# Patient Record
Sex: Male | Born: 1960 | Race: White | Hispanic: No | Marital: Married | Smoking: Never smoker
Health system: Southern US, Community
[De-identification: ages and names within clinical notes are randomized; demographics above are authoritative.]

## PROBLEM LIST (undated history)

## (undated) DIAGNOSIS — E079 Disorder of thyroid, unspecified: Secondary | ICD-10-CM

---

## 2000-01-03 ENCOUNTER — Encounter: Payer: Self-pay | Admitting: *Deleted

## 2000-01-03 ENCOUNTER — Encounter: Admission: RE | Admit: 2000-01-03 | Discharge: 2000-01-03 | Payer: Self-pay | Admitting: *Deleted

## 2014-03-31 ENCOUNTER — Ambulatory Visit (INDEPENDENT_AMBULATORY_CARE_PROVIDER_SITE_OTHER): Payer: BC Managed Care – PPO | Admitting: Internal Medicine

## 2014-03-31 ENCOUNTER — Encounter: Payer: Self-pay | Admitting: Internal Medicine

## 2014-03-31 ENCOUNTER — Other Ambulatory Visit: Payer: Self-pay | Admitting: *Deleted

## 2014-03-31 VITALS — BP 118/72 | HR 70 | Temp 98.3°F | Resp 12 | Ht 68.0 in | Wt 168.8 lb

## 2014-03-31 DIAGNOSIS — E038 Other specified hypothyroidism: Secondary | ICD-10-CM

## 2014-03-31 DIAGNOSIS — E063 Autoimmune thyroiditis: Principal | ICD-10-CM

## 2014-03-31 LAB — TSH: TSH: 7.61 u[IU]/mL — AB (ref 0.35–4.50)

## 2014-03-31 LAB — T4, FREE: FREE T4: 1.22 ng/dL (ref 0.60–1.60)

## 2014-03-31 MED ORDER — UNITHROID 137 MCG PO TABS: 137.0000 ug | ORAL_TABLET | Freq: Every day | ORAL | Status: DC

## 2014-03-31 NOTE — Patient Instructions (Signed)
Please stop at the lab. Please come back for a follow-up appointment in 6 months. We may need to re-check your thyroid labs until then, though.

## 2014-03-31 NOTE — Progress Notes (Signed)
Patient ID: Nathan BranchRoger C Claybrook, male   DOB: Jan 23, 1961, 53 y.o.   MRN: [id number]  HPI  Nathan Estrada is a 53 y.o.-year-old male, referred by his PCP, Dr. Tiburcio PeaHarris, for management of Hashimoto's hypothyroidism.  Pt. has been dx with hypothyroidism in 1998. TPO Abs 1479 at dx; is on Unithroid (levothyroxine, LT4) 125 mcg, taken: - fasting - with water - separated by >45 min from b'fast  - drinks coffee + almond milk - added milk since 12/2013 - no PPIs - + Ca (Tums) at night - + multivitamins at lunch  He tried Levothyroxine and Synthroid >> fatigue, fluctuating thyroid test.  I reviewed pt's thyroid tests: 06/03/2013: TSH 5.5, TPO Abs 202   Pt describes: - + weight gain (this year - mm mass) - + more fatigue, under a lot of stress - no more cold intolerance - no depression/+ anxiety - no constipation/diarrhea - + dry skin on eyelids - no hair falling  Pt denies feeling nodules in neck, hoarseness, dysphagia/odynophagia, SOB with lying down.  She has no FH of thyroid disorders, but has a FH of autoimmune ds (MS in mother, GF). No FH of thyroid cancer.  No h/o radiation tx to head or neck. No recent use of iodine supplements.  ROS: Constitutional: see HPI Eyes: + blurry vision, no xerophthalmia ENT: no sore throat, no nodules palpated in throat, no dysphagia/odynophagia, no hoarseness, + tinnitus Cardiovascular: no CP/SOB/palpitations/leg swelling Respiratory: no cough/SOB Gastrointestinal: no N/V/D/C Musculoskeletal: no muscle/joint aches Skin: no rashes Neurological: no tremors/numbness/tingling/dizziness Psychiatric: no depression/+ anxiety Also: Burning with urination, this is chronic and has been investigated by urology  Past medical history: - Mild hyperlipidemia - Dysthymia  Past surgical history: - None  History   Social History  . Marital Status: Married    Spouse Name: N/A    Number of Children: 5   Occupational History  . Engineer/counselor/student    Social History Main Topics  . Smoking status: Never Smoker   . Smokeless tobacco: No  . Alcohol Use: Yes, 1-2 drinks once a week: Beer and mixed drinks  . Drug Use: No  For exercise, he is walking, running, doing strength exercises, yoga 3-4 times a week  Current Outpatient Rx  Name  Route  Sig  Dispense  Refill  . Biotin (BIOTIN 5000) 5 MG CAPS   Oral   Take 1 capsule by mouth daily.         . Calcium Carbonate Antacid 500 MG CAPS   Oral   Take 2 capsules by mouth every evening.         . Cholecalciferol (VITAMIN D) 1000 UNITS capsule   Oral   Take 1,000 Units by mouth daily.         . CHOLINE PO   Oral   Take 1 capsule by mouth daily. 600 mg         . Coenzyme Q10 (COQ10) 100 MG CAPS   Oral   Take 1 capsule by mouth daily.         . Copper Gluconate (COPPER CAPS) 2 MG CAPS   Oral   Take 1 capsule by mouth daily.         . Cyanocobalamin (B-12) 1000 MCG CAPS   Oral   Take 1 capsule by mouth every evening.         . Inositol 500 MG TABS   Oral   Take 1 tablet by mouth daily.         .Marland Kitchen  Magnesium 500 MG CAPS   Oral   Take 2 capsules by mouth daily.         . Melatonin 5 MG CAPS   Oral   Take 3 capsules by mouth every evening.         . Potassium 99 MG TABS   Oral   Take 1 tablet by mouth daily.         . Selenium 200 MCG CAPS   Oral   Take 1 capsule by mouth daily.         Marland Kitchen. UNITHROID 125 MCG tablet               . Vitamins-Lipotropics (BALANCED B-50 COMPLEX) CAPS   Oral   Take 1 capsule by mouth daily.         . Zinc 30 MG CAPS   Oral   Take 1 capsule by mouth daily.          Allergies  Allergen Reactions  . Doxycycline Rash   Family history: See history of present illness +: - diabetes: Paternal grandmother - hypertension: Father - Hyperlipidemia: Father - Cancer: Father and grandfather (prostate)  PE: BP 118/72 mmHg  Pulse 70  Temp(Src) 98.3 F (36.8 C) (Oral)  Resp 12  Ht 5\' 8"  (1.727 m)  Wt 168  lb 12.8 oz (76.567 kg)  BMI 25.67 kg/m2  SpO2 97% Wt Readings from Last 3 Encounters:  03/31/14 168 lb 12.8 oz (76.567 kg)   Constitutional: normal weight, in NAD Eyes: PERRLA, EOMI, no exophthalmos ENT: moist mucous membranes, no thyromegaly, no cervical lymphadenopathy Cardiovascular: RRR, No MRG Respiratory: CTA B Gastrointestinal: abdomen soft, NT, ND, BS+ Musculoskeletal: no deformities, strength intact in all 4 Skin: moist, warm, no rashes Neurological: no tremor with outstretched hands, DTR normal in all 4  ASSESSMENT: 1. Hypothyroidism - 2/2 Hashimoto's thyroidism  PLAN:  1. Patient with long-standing hypothyroidism, on levothyroxine therapy (DAW Unithroid). He appears euthyroid. He does not appear to have a goiter, thyroid nodules, or neck compression symptoms - We discussed at length about the etiology, evolution, particularities of Hashimoto's thyroiditis. I also explained that I don't usually check thyroid antibodies after the diagnosis of Hashimoto's thyroiditis. We discussed ways to decrease the titer of the antibodies, mostly how to improve his immune system so that the thyroid inflammation caused by the antibodies is decreased. - We discussed about correct intake of levothyroxine, fasting, with water, separated by at least 30 minutes from breakfast, and separated by more than 4 hours from calcium, iron, multivitamins, acid reflux medications (PPIs). He is drinking coffee with almond milk every morning, approximately 50 minutes after he takes his levothyroxine. We discussed that we can maintain this, but will need to adjust his levothyroxine dose to accommodate him drinking the milk so close to the levothyroxine. - will check thyroid tests today: TSH, free T4 - If these are abnormal, he will need to return in 6-8 weeks for repeat labs - If these are normal, I will see him back in 4 months  Office Visit on 03/31/2014  Component Date Value Ref Range Status  . TSH 03/31/2014  7.61* 0.35 - 4.50 uIU/mL Final  . Free T4 03/31/2014 1.22  0.60 - 1.60 ng/dL Final   Will increase the Unithroid to 137 mcg daily. Will need a recheck of labs in 6-8 weeks.

## 2014-05-12 ENCOUNTER — Other Ambulatory Visit (INDEPENDENT_AMBULATORY_CARE_PROVIDER_SITE_OTHER): Payer: BC Managed Care – PPO

## 2014-05-12 DIAGNOSIS — E038 Other specified hypothyroidism: Secondary | ICD-10-CM

## 2014-05-12 DIAGNOSIS — E063 Autoimmune thyroiditis: Secondary | ICD-10-CM

## 2014-05-12 LAB — T4, FREE: Free T4: 1.12 ng/dL (ref 0.60–1.60)

## 2014-05-12 LAB — TSH: TSH: 5.19 u[IU]/mL — ABNORMAL HIGH (ref 0.35–4.50)

## 2014-05-13 ENCOUNTER — Other Ambulatory Visit: Payer: Self-pay | Admitting: Internal Medicine

## 2014-05-13 DIAGNOSIS — E038 Other specified hypothyroidism: Secondary | ICD-10-CM | POA: Insufficient documentation

## 2014-05-13 DIAGNOSIS — E039 Hypothyroidism, unspecified: Secondary | ICD-10-CM

## 2014-05-13 DIAGNOSIS — E063 Autoimmune thyroiditis: Secondary | ICD-10-CM | POA: Insufficient documentation

## 2014-05-13 MED ORDER — UNITHROID 150 MCG PO TABS: 150.0000 ug | ORAL_TABLET | Freq: Every day | ORAL | Status: DC

## 2014-08-17 ENCOUNTER — Telehealth: Payer: Self-pay | Admitting: Internal Medicine

## 2014-08-17 ENCOUNTER — Other Ambulatory Visit (INDEPENDENT_AMBULATORY_CARE_PROVIDER_SITE_OTHER): Payer: BC Managed Care – PPO

## 2014-08-17 DIAGNOSIS — E039 Hypothyroidism, unspecified: Secondary | ICD-10-CM | POA: Diagnosis not present

## 2014-08-17 LAB — T4, FREE: FREE T4: 1.49 ng/dL (ref 0.60–1.60)

## 2014-08-17 LAB — TSH: TSH: 1.93 u[IU]/mL (ref 0.35–4.50)

## 2014-08-17 NOTE — Telephone Encounter (Signed)
Please read message below. I will call pt with his lab results and medication adjustment. Thank you.

## 2014-08-17 NOTE — Telephone Encounter (Signed)
Pt cask that before his medicine is adjusted after  lab results that dr or nurse give him a call

## 2014-08-17 NOTE — Telephone Encounter (Signed)
Labs released in MyChart earlier.

## 2014-08-18 NOTE — Telephone Encounter (Signed)
Called pt and lvm advising him of his lab results. No medication change. Continue on same dose of Unithroid 150 mcg. Advised pt to call back with any questions.

## 2014-09-10 ENCOUNTER — Other Ambulatory Visit: Payer: Self-pay | Admitting: *Deleted

## 2014-09-10 MED ORDER — UNITHROID 150 MCG PO TABS: 150.0000 ug | ORAL_TABLET | Freq: Every day | ORAL | Status: DC

## 2014-09-29 ENCOUNTER — Ambulatory Visit: Payer: BC Managed Care – PPO | Admitting: Internal Medicine

## 2014-10-12 ENCOUNTER — Ambulatory Visit
Admission: RE | Admit: 2014-10-12 | Discharge: 2014-10-12 | Disposition: A | Discharge status: Home / Self Care | Payer: BC Managed Care – PPO | Source: Ambulatory Visit | Attending: Family Medicine | Admitting: Family Medicine

## 2014-10-12 ENCOUNTER — Other Ambulatory Visit: Payer: Self-pay | Admitting: Family Medicine

## 2014-10-12 DIAGNOSIS — R059 Cough, unspecified: Secondary | ICD-10-CM

## 2014-10-12 DIAGNOSIS — R05 Cough: Secondary | ICD-10-CM

## 2014-10-20 ENCOUNTER — Ambulatory Visit (INDEPENDENT_AMBULATORY_CARE_PROVIDER_SITE_OTHER): Payer: BC Managed Care – PPO | Admitting: Internal Medicine

## 2014-10-20 ENCOUNTER — Encounter: Payer: Self-pay | Admitting: Internal Medicine

## 2014-10-20 VITALS — BP 122/68 | HR 54 | Temp 97.4°F | Resp 12 | Wt 173.8 lb

## 2014-10-20 DIAGNOSIS — E038 Other specified hypothyroidism: Secondary | ICD-10-CM

## 2014-10-20 LAB — T4, FREE: Free T4: 1.43 ng/dL (ref 0.60–1.60)

## 2014-10-20 LAB — TSH: TSH: 0.41 u[IU]/mL (ref 0.35–4.50)

## 2014-10-20 MED ORDER — UNITHROID 150 MCG PO TABS: 150.0000 ug | ORAL_TABLET | Freq: Every day | ORAL | Status: DC

## 2014-10-20 NOTE — Patient Instructions (Signed)
Please stop at the lab.  Please return in 1 year.  

## 2014-10-20 NOTE — Progress Notes (Signed)
Patient ID: Nathan Estrada, male   DOB: Apr 04, 1961, 54 y.o.   MRN: [id number]  HPI  Nathan Estrada is a 54 y.o.-year-old male, referred by his PCP, Dr. Tiburcio Pea, for management of Hashimoto's hypothyroidism. Last visit   Pt. has been dx with hypothyroidism in 1998. TPO Abs 1479 at dx; is on Unithroid (levothyroxine, LT4) 125 >> 137 >> 150 mcg now, taken: - fasting - with water - separated by >45 min from b'fast  - drinks coffee + almond milk - added milk since 12/2013 - + Ca (Tums) at night - + multivitamins at lunch - + PPIs at bedtime  He tried Levothyroxine and Synthroid >> fatigue, fluctuating thyroid test.  I reviewed pt's thyroid tests: Lab Results  Component Value Date   TSH 1.93 08/17/2014   TSH 5.19* 05/12/2014   TSH 7.61* 03/31/2014   FREET4 1.49 08/17/2014   FREET4 1.12 05/12/2014   FREET4 1.22 03/31/2014   06/03/2013: TSH 5.5, TPO Abs 202   Pt describes: - + weight gain - + increased appetite - no fatigue - no more cold intolerance - no depression/+ anxiety - no constipation/diarrhea - + dry skin on eyelids, improved - no hair falling  Pt denies feeling nodules in neck, hoarseness, dysphagia/odynophagia, SOB with lying down.  ROS: Constitutional: see HPI Eyes: + blurry vision (works on [employer]im[employer]), no xerophthalmia ENT: + sore throat (acid reflux), no nodules palpated in throat, no dysphagia/odynophagia, no hoarseness, + tinnitus Cardiovascular: no CP/SOB/palpitations/leg swelling Respiratory: no cough/SOB Gastrointestinal: no N/V/D/C, + acid reflux Musculoskeletal: no muscle/joint aches Skin: no rashes Neurological: no tremors/numbness/tingling/dizziness, + HA  Past medical history: - Mild hyperlipidemia - Dysthymia  Past surgical history: - None  History   Social History  . Marital Status: Married    Spouse Name: N/A    Number of Children: 5   Occupational History  . Engineer/counselor/student   Social History Main Topics  . Smoking  status: Never Smoker   . Smokeless tobacco: No  . Alcohol Use: Yes, 1-2 drinks once a week: Beer and mixed drinks  . Drug Use: No  For exercise, he is walking, running, doing strength exercises, yoga 3-4 times a week  Current Outpatient Rx  Name  Route  Sig  Dispense  Refill  . Biotin (BIOTIN 5000) 5 MG CAPS   Oral   Take 1 capsule by mouth daily.         . Calcium Carbonate Antacid 500 MG CAPS   Oral   Take 2 capsules by mouth every evening.         . Cholecalciferol (VITAMIN D) 1000 UNITS capsule   Oral   Take 1,000 Units by mouth daily.         . CHOLINE PO   Oral   Take 1 capsule by mouth daily. 600 mg         . Coenzyme Q10 (COQ10) 100 MG CAPS   Oral   Take 1 capsule by mouth daily.         . Copper Gluconate (COPPER CAPS) 2 MG CAPS   Oral   Take 1 capsule by mouth daily.         . Cyanocobalamin (B-12) 1000 MCG CAPS   Oral   Take 1 capsule by mouth every evening.         . Inositol 500 MG TABS   Oral   Take 1 tablet by mouth daily.         . Magnesium 500 MG  CAPS   Oral   Take 2 capsules by mouth daily.         . Melatonin 5 MG CAPS   Oral   Take 3 capsules by mouth every evening.         . Potassium 99 MG TABS   Oral   Take 1 tablet by mouth daily.         . Selenium 200 MCG CAPS   Oral   Take 1 capsule by mouth daily.         Marland Kitchen. UNITHROID 150 MCG tablet   Oral   Take 1 tablet (150 mcg total) by mouth daily before breakfast.   60 tablet   0     Dispense as written.   . Vitamins-Lipotropics (BALANCED B-50 COMPLEX) CAPS   Oral   Take 1 capsule by mouth daily.         . Zinc 30 MG CAPS   Oral   Take 1 capsule by mouth daily.          Allergies  Allergen Reactions  . Doxycycline Rash   Family history: See history of present illness +: - diabetes: Paternal grandmother - hypertension: Father - Hyperlipidemia: Father - Cancer: Father and grandfather (prostate)  PE: BP 122/68 mmHg  Pulse 54  Temp(Src) 97.4  F (36.3 C) (Oral)  Resp 12  Wt 173 lb 12.8 oz (78.835 kg)  SpO2 97% Body mass index is 26.43 kg/(m^2).  Wt Readings from Last 3 Encounters:  10/20/14 173 lb 12.8 oz (78.835 kg)  03/31/14 168 lb 12.8 oz (76.567 kg)   Constitutional: normal weight, in NAD Eyes: PERRLA, EOMI, no exophthalmos ENT: moist mucous membranes, no thyromegaly, no cervical lymphadenopathy Cardiovascular: RRR, No MRG Respiratory: CTA B Gastrointestinal: abdomen soft, NT, ND, BS+ Musculoskeletal: no deformities, strength intact in all 4 Skin: moist, warm, no rashes Neurological: no tremor with outstretched hands, DTR normal in all 4  ASSESSMENT: 1. Hypothyroidism - 2/2 Hashimoto's thyroidism  PLAN:  1. Patient with long-standing hypothyroidism, on levothyroxine therapy (DAW Unithroid). He appears euthyroid. He does not appear to have a goiter, thyroid nodules, or neck compression symptoms - We discussed about correct intake of levothyroxine, fasting, with water, separated by at least 30 minutes from breakfast, and separated by more than 4 hours from calcium, iron, multivitamins, acid reflux medications (PPIs). He is drinking coffee with almond milk every morning, soon after he takes his levothyroxine. We discussed that we can maintain this if he does this every day. - will check thyroid tests today: TSH, free T4 - If these are abnormal, he will need to return in 6-8 weeks for repeat labs - If these are normal, I will see him back in 1 year  He needs a refill. -  3 mo  Office Visit on 10/20/2014  Component Date Value Ref Range Status  . TSH 10/20/2014 0.41  0.35 - 4.50 uIU/mL Final  . Free T4 10/20/2014 1.43  0.60 - 1.60 ng/dL Final   Thyroid labs normal,  will refill his levothyroxine.

## 2015-01-21 ENCOUNTER — Encounter: Payer: Self-pay | Admitting: Internal Medicine

## 2015-01-21 NOTE — Telephone Encounter (Signed)
Scheduled pt tomorrow morning.

## 2015-01-22 ENCOUNTER — Encounter: Payer: Self-pay | Admitting: Internal Medicine

## 2015-01-22 ENCOUNTER — Ambulatory Visit (INDEPENDENT_AMBULATORY_CARE_PROVIDER_SITE_OTHER): Payer: BC Managed Care – PPO | Admitting: Internal Medicine

## 2015-01-22 VITALS — BP 114/68 | HR 55 | Temp 98.4°F | Resp 12 | Wt 168.0 lb

## 2015-01-22 DIAGNOSIS — E063 Autoimmune thyroiditis: Secondary | ICD-10-CM

## 2015-01-22 DIAGNOSIS — E038 Other specified hypothyroidism: Secondary | ICD-10-CM

## 2015-01-22 LAB — TSH: TSH: 0.68 u[IU]/mL (ref 0.35–4.50)

## 2015-01-22 LAB — T4, FREE: Free T4: 1.53 ng/dL (ref 0.60–1.60)

## 2015-01-22 NOTE — Progress Notes (Addendum)
Patient ID: Nathan Estrada, male   DOB: 03/01/1961, 54 y.o.   MRN: [id number]  HPI  Nathan Estrada is a 54 y.o.-year-old male, returning for f/u for Hashimoto's hypothyroidism, dx 1998. Last visit 3 mo ago.  Pt called the office the other day that he would like to schedule an appt as he is feeling more depressed lately - reviewed tel note: 01/21/2015: Dr. Elvera Lennox,  I would like to see you as soon as possible in order to discuss my current symptoms, check my blood levels and immune system functioning. Since my last appointment with you in April, and after the positive improvements in my TSH, I added running to my exercise regimen. Overall, my fitness has improved significantly, which I'm glad about, but I've recently been having bouts of sadness, weepiness, and lack of motivation, so I'm concerned the new regimen has upset the balance.   He started to run consistently over the summer >> 18 miles a week. He noticed depression intensified last week, but actually started in 12/2014. He also noticed weight change (highest 12/21/2014 - 170 >> now 164 with calorie restriction). He also had heat intolerance off and on.  Pt is on Unithroid (levothyroxine, LT4) 125 >> 137 >> 150 mcg now, taken: - fasting - with water - separated by >45 min from b'fast  - drinks coffee + almond milk - added milk since 12/2013 - + Ca (Tums) at night - + multivitamins at lunch - + PPIs at bedtime  He tried Levothyroxine and Synthroid >> fatigue, fluctuating thyroid tests.  He stopped Biotin last visit.   I reviewed pt's thyroid tests: Lab Results  Component Value Date   TSH 0.41 10/20/2014   TSH 1.93 08/17/2014   TSH 5.19* 05/12/2014   TSH 7.61* 03/31/2014   FREET4 1.43 10/20/2014   FREET4 1.49 08/17/2014   FREET4 1.12 05/12/2014   FREET4 1.22 03/31/2014   06/03/2013: TSH 5.5, TPO Abs 202   TPO Abs 1479 at dx  Pt describes: - + weight gain and loss - no fatigue - + off and on heat intolerance - +  depression - no constipation/diarrhea - no hair loss  Pt denies feeling nodules in neck, hoarseness, dysphagia/odynophagia, SOB with lying down.  ROS + see HPI Constitutional: see HPI Eyes: no blurry vision, no xerophthalmia ENT: no sore throat, no nodules palpated in throat, no dysphagia/odynophagia, no hoarseness Cardiovascular: no CP/SOB/palpitations/leg swelling Respiratory: no cough/SOB Gastrointestinal: no N/V/D/C Musculoskeletal: no muscle/joint aches Skin: no rashes Neurological: no tremors/numbness/tingling/dizziness  Past medical history: - Mild hyperlipidemia - Dysthymia  Past surgical history: - None  History   Social History  . Marital Status: Married    Spouse Name: N/A    Number of Children: 5   Occupational History  . Engineer/counselor/student   Social History Main Topics  . Smoking status: Never Smoker   . Smokeless tobacco: No  . Alcohol Use: Yes, 1-2 drinks once a week: Beer and mixed drinks  . Drug Use: No  For exercise, he is walking, running, doing strength exercises, yoga 3-4 times a week  Current Outpatient Rx  Name  Route  Sig  Dispense  Refill  . Biotin (BIOTIN 5000) 5 MG CAPS   Oral   Take 1 capsule by mouth daily.         . Calcium Carbonate Antacid 500 MG CAPS   Oral   Take 2 capsules by mouth every evening.         . Cholecalciferol (VITAMIN  D) 1000 UNITS capsule   Oral   Take 1,000 Units by mouth daily.         . CHOLINE PO   Oral   Take 1 capsule by mouth daily. 600 mg         . Coenzyme Q10 (COQ10) 100 MG CAPS   Oral   Take 1 capsule by mouth daily.         . Copper Gluconate (COPPER CAPS) 2 MG CAPS   Oral   Take 1 capsule by mouth daily.         . Cyanocobalamin (B-12) 1000 MCG CAPS   Oral   Take 1 capsule by mouth every evening.         . Inositol 500 MG TABS   Oral   Take 1 tablet by mouth daily.         . Magnesium 500 MG CAPS   Oral   Take 2 capsules by mouth daily.         .  Melatonin 5 MG CAPS   Oral   Take 3 capsules by mouth every evening.         . Potassium 99 MG TABS   Oral   Take 1 tablet by mouth daily.         . Selenium 200 MCG CAPS   Oral   Take 1 capsule by mouth daily.         Marland Kitchen UNITHROID 150 MCG tablet   Oral   Take 1 tablet (150 mcg total) by mouth daily before breakfast.   90 tablet   3     Dispense as written.   . Vitamins-Lipotropics (BALANCED B-50 COMPLEX) CAPS   Oral   Take 1 capsule by mouth daily.         . Zinc 30 MG CAPS   Oral   Take 1 capsule by mouth daily.          Allergies  Allergen Reactions  . Doxycycline Rash   Family history: See history of present illness +: - diabetes: Paternal grandmother - hypertension: Father - Hyperlipidemia: Father - Cancer: Father and grandfather (prostate)  PE: BP 114/68 mmHg  Pulse 55  Temp(Src) 98.4 F (36.9 C) (Oral)  Resp 12  Wt 168 lb (76.204 kg)  SpO2 96% Body mass index is 25.55 kg/(m^2).  Wt Readings from Last 3 Encounters:  01/22/15 168 lb (76.204 kg)  10/20/14 173 lb 12.8 oz (78.835 kg)  03/31/14 168 lb 12.8 oz (76.567 kg)   Constitutional: normal weight, in NAD Eyes: PERRLA, EOMI, no exophthalmos ENT: moist mucous membranes, no thyromegaly, no cervical lymphadenopathy Cardiovascular: RRR, No MRG Respiratory: CTA B Gastrointestinal: abdomen soft, NT, ND, BS+ Musculoskeletal: no deformities, strength intact in all 4 Skin: moist, warm, no rashes Neurological: no tremor with outstretched hands, DTR normal in all 4  ASSESSMENT: 1. Hypothyroidism - 2/2 Hashimoto's thyroidism  PLAN:  1. Patient with long-standing hypothyroidism, on levothyroxine therapy (DAW Unithroid). He appears euthyroid, but had more depressive sxs lately. He does not appear to have a goiter, thyroid nodules, or neck compression symptoms. - We discussed about correct intake of levothyroxine, fasting, with water, separated by at least 30 minutes from breakfast, and separated  by more than 4 hours from calcium, iron, multivitamins, acid reflux medications (PPIs). He is taking it correctly. - will check thyroid tests today: TSH, free T4, TPO Abs - advised to separate Selenium in 2 doses - restart Biotin - If these are abnormal,  he will need to return in 6-8 weeks for repeat labs - OTW, I will see him in 09/2015  Office Visit on 01/22/2015  Component Date Value Ref Range Status  . TSH 01/22/2015 0.68  0.35 - 4.50 uIU/mL Final  . Free T4 01/22/2015 1.53  0.60 - 1.60 ng/dL Final  . Thyroperoxidase Ab SerPl-aCnc 01/22/2015 201* <9 IU/mL Final   TFTs normal; antibodies are elevated, confirming Hashimoto's thyroiditis.  The thyroid antibody titer is not higher compared with dietary 05/2013.

## 2015-01-22 NOTE — Patient Instructions (Signed)
Please stop at the lab.  Please come back for a follow-up appointment in 6 months.  

## 2015-01-23 LAB — THYROID PEROXIDASE ANTIBODY: Thyroperoxidase Ab SerPl-aCnc: 201 IU/mL — ABNORMAL HIGH (ref <9)

## 2015-01-26 ENCOUNTER — Encounter: Payer: Self-pay | Admitting: Internal Medicine

## 2015-10-11 ENCOUNTER — Ambulatory Visit (INDEPENDENT_AMBULATORY_CARE_PROVIDER_SITE_OTHER): Payer: BC Managed Care – PPO | Admitting: Internal Medicine

## 2015-10-11 ENCOUNTER — Encounter: Payer: Self-pay | Admitting: Internal Medicine

## 2015-10-11 VITALS — BP 120/84 | HR 57 | Temp 98.1°F | Resp 12 | Wt 171.0 lb

## 2015-10-11 DIAGNOSIS — E063 Autoimmune thyroiditis: Secondary | ICD-10-CM | POA: Diagnosis not present

## 2015-10-11 DIAGNOSIS — E038 Other specified hypothyroidism: Secondary | ICD-10-CM

## 2015-10-11 LAB — T4, FREE: Free T4: 1.43 ng/dL (ref 0.60–1.60)

## 2015-10-11 LAB — T3, FREE: T3, Free: 3.6 pg/mL (ref 2.3–4.2)

## 2015-10-11 LAB — TSH: TSH: 0.67 u[IU]/mL (ref 0.35–4.50)

## 2015-10-11 NOTE — Progress Notes (Signed)
Patient ID: Nathan Estrada, male   DOB: [birth year], 55 y.o.   MRN: [id number]  HPI  Nathan BranchRoger C Frankowski is a 55 y.o.-year-old male, returning for f/u for Hashimoto's hypothyroidism, dx 1998. Last visit 8 mo ago.  Pt is on Unithroid (levothyroxine, LT4) 125 >> 137 >> 150 mcg now, taken: - fasting - with water - separated by >45 min from b'fast  - + Ca (Tums) at bedtime - intermittently - + multivitamins at lunch - + PPIs at bedtime  He tried Levothyroxine and Synthroid >> fatigue, fluctuating thyroid tests. Now stable on thyroid tests.  He was on Biotin, stopped last week.  I reviewed pt's thyroid tests: Component     Latest Ref Rng 01/22/2015  Thyroperoxidase Ab SerPl-aCnc     <9 IU/mL 201 (H)   Lab Results  Component Value Date   TSH 0.68 01/22/2015   TSH 0.41 10/20/2014   TSH 1.93 08/17/2014   TSH 5.19* 05/12/2014   TSH 7.61* 03/31/2014   FREET4 1.53 01/22/2015   FREET4 1.43 10/20/2014   FREET4 1.49 08/17/2014   FREET4 1.12 05/12/2014   FREET4 1.22 03/31/2014  06/03/2013: TSH 5.5, TPO Abs 202   TPO Abs 1479 at dx  Pt denies: - weight gain and loss - fatigue except in the pm - heat intolerance - improved depression - constipation/diarrhea - hair loss  Pt denies feeling nodules in neck, hoarseness, dysphagia/odynophagia, SOB with lying down.  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  ROS + see HPI Constitutional: see HPI Eyes: no blurry vision, no xerophthalmia ENT: no sore throat, no nodules palpated in throat, no dysphagia/odynophagia, no hoarseness Cardiovascular: no CP/SOB/palpitations/leg swelling Respiratory: no cough/SOB Gastrointestinal: no N/V/D/C Musculoskeletal: no muscle/joint aches Skin: no rashes Neurological: no tremors/numbness/tingling/dizziness  I reviewed pt's medications, allergies, PMH, social hx, family hx, and changes were documented in the history of present illness. Otherwise, unchanged from my  initial visit note.  Past medical history: - Mild hyperlipidemia - Dysthymia  Past surgical history: - None  History   Social History  . Marital Status: Married    Spouse Name: N/A    Number of Children: 5   Occupational History  . Engineer/counselor/student   Social History Main Topics  . Smoking status: Never Smoker   . Smokeless tobacco: No  . Alcohol Use: Yes, 1-2 drinks once a week: Beer and mixed drinks  . Drug Use: No  For exercise, he is walking, running, doing strength exercises, yoga 3-4 times a week  Current Outpatient Rx  Name  Route  Sig  Dispense  Refill  . Biotin (BIOTIN 5000) 5 MG CAPS   Oral   Take 1 capsule by mouth daily.         . Calcium Carbonate Antacid 500 MG CAPS   Oral   Take 2 capsules by mouth every evening.         . Cholecalciferol (VITAMIN D) 1000 UNITS capsule   Oral   Take 1,000 Units by mouth daily.         . CHOLINE PO   Oral   Take 1 capsule by mouth daily. 600 mg         . Coenzyme Q10 (COQ10) 100 MG CAPS   Oral   Take 1 capsule by mouth daily.         . Copper Gluconate (COPPER CAPS) 2 MG CAPS   Oral   Take 1 capsule by mouth daily.         .Marland Kitchen  Cyanocobalamin (B-12) 1000 MCG CAPS   Oral   Take 1 capsule by mouth every evening.         . Flaxseed, Linseed, (FLAX SEED OIL) 1000 MG CAPS   Oral   Take 1 capsule by mouth daily.         . Inositol 500 MG TABS   Oral   Take 1 tablet by mouth daily.         . Magnesium 500 MG CAPS   Oral   Take 2 capsules by mouth daily.         . Melatonin 5 MG CAPS   Oral   Take 3 capsules by mouth every evening.         . Potassium 99 MG TABS   Oral   Take 1 tablet by mouth daily.         . Selenium 200 MCG CAPS   Oral   Take 1 capsule by mouth daily.         Marland Kitchen UNITHROID 150 MCG tablet   Oral   Take 1 tablet (150 mcg total) by mouth daily before breakfast.   90 tablet   3     Dispense as written.   . Vitamins-Lipotropics (BALANCED B-50  COMPLEX) CAPS   Oral   Take 1 capsule by mouth daily.         . Zinc 30 MG CAPS   Oral   Take 1 capsule by mouth daily.          Allergies  Allergen Reactions  . Doxycycline Rash   Family history: See history of present illness +: - diabetes: Paternal grandmother - hypertension: Father - Hyperlipidemia: Father - Cancer: Father and grandfather (prostate)  PE: BP 120/84 mmHg  Pulse 57  Temp(Src) 98.1 F (36.7 C) (Oral)  Resp 12  Wt 171 lb (77.565 kg)  SpO2 97% Body mass index is 26.01 kg/(m^2).  Wt Readings from Last 3 Encounters:  10/11/15 171 lb (77.565 kg)  01/22/15 168 lb (76.204 kg)  10/20/14 173 lb 12.8 oz (78.835 kg)   Constitutional: normal weight, in NAD Eyes: PERRLA, EOMI, no exophthalmos ENT: moist mucous membranes, no thyromegaly, no cervical lymphadenopathy Cardiovascular: RRR, No MRG Respiratory: CTA B Gastrointestinal: abdomen soft, NT, ND, BS+ Musculoskeletal: no deformities, strength intact in all 4 Skin: moist, warm, no rashes Neurological: no tremor with outstretched hands, DTR normal in all 4  ASSESSMENT: 1. Hypothyroidism - 2/2 Hashimoto's thyroidism  PLAN:  1. Patient with long-standing hypothyroidism, on levothyroxine therapy (DAW Unithroid). He appears euthyroid and has no complaints other than some pm fatigue. He is able to run ~17 miles a week w/o pbs. No more depression as he sleeps better. He does not appear to have a goiter, thyroid nodules, or neck compression symptoms. - We discussed about correct intake of levothyroxine, fasting, with water, separated by at least 30 minutes from breakfast, and separated by more than 4 hours from calcium, iron, multivitamins, acid reflux medications (PPIs). He is taking it correctly. - will check thyroid tests today: TSH, free T4, free t3 - continue Selenium  - If labs are abnormal, he will need to return in 6-8 weeks for repeat labs - OTW, I will see him in 09/2016  Office Visit on 10/11/2015   Component Date Value Ref Range Status  . Free T4 10/11/2015 1.43  0.60 - 1.60 ng/dL Final  . T3, Free 16/02/9603 3.6  2.3 - 4.2 pg/mL Final  . TSH 10/11/2015 0.67  0.35 - 4.50 uIU/mL Final   Labs are great!

## 2015-10-11 NOTE — Patient Instructions (Signed)
Please continue Unithroid 150 mcg daily.  Take the thyroid hormone every day, with water, at least 30 minutes before breakfast, separated by at least 4 hours from: - acid reflux medications - calcium - iron - multivitamins  Please return in 1 year.

## 2015-10-25 ENCOUNTER — Other Ambulatory Visit: Payer: Self-pay | Admitting: Internal Medicine

## 2016-10-10 ENCOUNTER — Encounter: Payer: Self-pay | Admitting: Internal Medicine

## 2016-10-10 ENCOUNTER — Ambulatory Visit (INDEPENDENT_AMBULATORY_CARE_PROVIDER_SITE_OTHER): Payer: BC Managed Care – PPO | Admitting: Internal Medicine

## 2016-10-10 VITALS — BP 112/74 | HR 78 | Wt 167.0 lb

## 2016-10-10 DIAGNOSIS — E063 Autoimmune thyroiditis: Secondary | ICD-10-CM

## 2016-10-10 DIAGNOSIS — E038 Other specified hypothyroidism: Secondary | ICD-10-CM | POA: Diagnosis not present

## 2016-10-10 LAB — TSH: TSH: 2.42 u[IU]/mL (ref 0.35–4.50)

## 2016-10-10 LAB — T4, FREE: FREE T4: 1.41 ng/dL (ref 0.60–1.60)

## 2016-10-10 NOTE — Progress Notes (Signed)
Patient ID: Nathan Estrada, male   DOB: 1960-10-19, 56 y.o.   MRN: [id number]  HPI  Nathan Estrada is a 56 y.o.-year-old male, returning for f/u for Hashimoto's hypothyroidism, dx 1998. Last visit 1 year ago.  Pt is on Unithroid (levothyroxine) 150 mcg/day: - in am - with water - fasting - at least 45 min from b'fast  - + Tums in the evening - occas. - + MVI moved 1-2h after LT4 - no PPIs  He drinks coffee + half and half.  He tried Levothyroxine and Synthroid >> fatigue, fluctuating TFTs.  He was on Biotin, but stopped months ago.  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  ROS: Constitutional: no weight gain/no weight loss, no fatigue, no subjective hyperthermia, no subjective hypothermia, + insomnia occas. Eyes: no blurry vision, no xerophthalmia ENT: no sore throat, no nodules palpated in throat, no dysphagia, no odynophagia, no hoarseness Cardiovascular: no CP/no SOB/no palpitations/no leg swelling Respiratory: no cough/no SOB/no wheezing Gastrointestinal: no N/no V/no D/no C/no acid reflux Musculoskeletal: no muscle aches/no joint aches Skin: no rashes, no hair loss Neurological: no tremors/no numbness/no tingling/no dizziness  I reviewed pt's medications, allergies, PMH, social hx, family hx, and changes were documented in the history of present illness. Otherwise,  unchanged from my initial visit note.  Past medical history: - Mild hyperlipidemia - Dysthymia  Past surgical history: - None  History   Social History  . Marital Status: Married    Spouse Name: N/A    Number of Children: 5   Occupational History  . Engineer/counselor/student   Social History Main Topics  . Smoking status: Never Smoker   . Smokeless tobacco: No  . Alcohol Use: Yes, 1-2 drinks once a week: Beer and mixed drinks  . Drug Use: No  For exercise, he is walking, running, doing strength exercises, yoga 3-4 times a week  Current Outpatient Prescriptions  Medication Sig Dispense Refill  . Biotin (BIOTIN 5000) 5 MG CAPS Take 1 capsule by mouth daily.    . Calcium Carbonate Antacid 500 MG CAPS Take 2 capsules by mouth every evening.    . Cholecalciferol (VITAMIN D) 1000 UNITS capsule Take 1,000 Units by mouth daily.    . CHOLINE PO Take 1 capsule by mouth daily. 600 mg    . Coenzyme Q10 (COQ10) 100 MG CAPS Take 1 capsule by mouth daily.    . Copper Gluconate (COPPER CAPS) 2 MG CAPS Take 1 capsule by mouth daily.    . Cyanocobalamin (B-12) 1000 MCG CAPS Take 1 capsule by mouth every evening.    . Flaxseed, Linseed, (FLAX SEED OIL) 1000 MG CAPS Take 1 capsule by mouth daily.    . Inositol 500 MG TABS Take 1 tablet by mouth daily.    Marland Kitchen  Magnesium 500 MG CAPS Take 2 capsules by mouth daily.    . Melatonin 5 MG CAPS Take 3 capsules by mouth every evening.    . Potassium 99 MG TABS Take 1 tablet by mouth daily.    . Selenium 200 MCG CAPS Take 1 capsule by mouth daily.    Marland Kitchen. UNITHROID 150 MCG tablet TAKE 1 TABLET (150 MCG TOTAL) BY MOUTH DAILY BEFORE BREAKFAST. 90 tablet 3  . Vitamins-Lipotropics (BALANCED B-50 COMPLEX) CAPS Take 1 capsule by mouth daily.    . Zinc 30 MG CAPS Take 1 capsule by mouth daily.     No current facility-administered medications for this visit.    Allergies  Allergen Reactions  . Doxycycline Rash   Family history: See history of present illness  +: - diabetes: Paternal grandmother - hypertension: Father - Hyperlipidemia: Father - Cancer: Father and grandfather (prostate)  PE: BP 112/74 (BP Location: Left Arm, Patient Position: Sitting)   Pulse 78   Wt 167 lb (75.8 kg)   SpO2 97%   BMI 25.39 kg/m  Body mass index is 25.39 kg/m.  Wt Readings from Last 3 Encounters:  10/10/16 167 lb (75.8 kg)  10/11/15 171 lb (77.6 kg)  01/22/15 168 lb (76.2 kg)   Constitutional: normal weight, in NAD Eyes: PERRLA, EOMI, no exophthalmos ENT: moist mucous membranes, no thyromegaly, no cervical lymphadenopathy Cardiovascular: RRR, No MRG Respiratory: CTA B Gastrointestinal: abdomen soft, NT, ND, BS+ Musculoskeletal: no deformities, strength intact in all 4 Skin: moist, warm, no rashes Neurological: no tremor with outstretched hands, DTR normal in all 4  ASSESSMENT: 1. Hypothyroidism - 2/2 Hashimoto's thyroidism  PLAN:  1. Patient with long-standing hypothyroidism, on levothyroxine therapy (DAW Unithroid).  He does not appear to have a goiter, thyroid nodules, or neck compression symptoms. - latest thyroid labs reviewed with pt >> normal  - he continues on LT4 150 mcg daily - pt feels good on this dose. - we discussed about taking the thyroid hormone every day, with water, >30 minutes before breakfast, separated by >4 hours from acid reflux medications, calcium, iron, multivitamins. He moved his MVI only 1-2h after b'fast >> advised to move them b/f lunch. He also drinks coffee + half and half immediately after LT4, but does this every am. - will check thyroid tests today: TSH and fT4 - If labs are abnormal, he will need to return for repeat TFTs in 1.5 months - OTW, I will see him back in 09/2017  Pt needs a message about labs before refills.  Office Visit on 10/10/2016  Component Date Value Ref Range Status  . TSH 10/10/2016 2.42  0.35 - 4.50 uIU/mL Final  . Free T4 10/10/2016 1.41  0.60 - 1.60 ng/dL Final   Comment: Specimens  from patients who are undergoing biotin therapy and /or ingesting biotin supplements may contain high levels of biotin.  The higher biotin concentration in these specimens interferes with this Free T4 assay.  Specimens that contain high levels  of biotin may cause false high results for this Free T4 assay.  Please interpret results in light of the total clinical presentation of the patient.     Message sent: Dear Mr. Brendia Sacksritchard, The thyroid tests are normal, but the TSH is higher than before, likely because of the multivitamins. No need to change the dose of Unithroid for now, but less tried to move the multivitamins later, as we discussed. Sincerely, Carlus Pavlovristina Darry Kelnhofer MD  Carlus Pavlovristina Skyleen Bentley, MD PhD [facility]

## 2016-10-10 NOTE — Patient Instructions (Signed)
Please stop at the lab.  Please continue Unithroid 150 mcg daily.  Take the thyroid hormone every day, with water, at least 30 minutes before breakfast, separated by at least 4 hours from: - acid reflux medications - calcium - iron - multivitamins  Please return in 1 year.  

## 2016-10-11 ENCOUNTER — Other Ambulatory Visit: Payer: Self-pay | Admitting: Internal Medicine

## 2016-10-11 DIAGNOSIS — E063 Autoimmune thyroiditis: Principal | ICD-10-CM

## 2016-10-11 DIAGNOSIS — E038 Other specified hypothyroidism: Secondary | ICD-10-CM

## 2016-10-11 MED ORDER — UNITHROID 150 MCG PO TABS: ORAL_TABLET | ORAL | 3 refills | Status: DC

## 2016-10-27 ENCOUNTER — Other Ambulatory Visit: Payer: Self-pay | Admitting: Internal Medicine

## 2016-12-11 ENCOUNTER — Encounter: Payer: Self-pay | Admitting: Internal Medicine

## 2017-01-03 ENCOUNTER — Other Ambulatory Visit (INDEPENDENT_AMBULATORY_CARE_PROVIDER_SITE_OTHER): Payer: BC Managed Care – PPO

## 2017-01-03 DIAGNOSIS — E038 Other specified hypothyroidism: Secondary | ICD-10-CM | POA: Diagnosis not present

## 2017-01-03 DIAGNOSIS — E063 Autoimmune thyroiditis: Secondary | ICD-10-CM | POA: Diagnosis not present

## 2017-01-03 LAB — TSH: TSH: 2.23 u[IU]/mL (ref 0.35–4.50)

## 2017-01-03 LAB — T4, FREE: Free T4: 1.54 ng/dL (ref 0.60–1.60)

## 2017-02-08 ENCOUNTER — Encounter (HOSPITAL_COMMUNITY): Payer: Self-pay | Admitting: Emergency Medicine

## 2017-02-08 ENCOUNTER — Emergency Department (HOSPITAL_COMMUNITY): Payer: BC Managed Care – PPO

## 2017-02-08 ENCOUNTER — Emergency Department (HOSPITAL_COMMUNITY)
Admission: EM | Admit: 2017-02-08 | Discharge: 2017-02-08 | Disposition: A | Discharge status: Home / Self Care | Payer: BC Managed Care – PPO | Attending: Emergency Medicine | Admitting: Emergency Medicine

## 2017-02-08 DIAGNOSIS — Z79899 Other long term (current) drug therapy: Secondary | ICD-10-CM | POA: Diagnosis not present

## 2017-02-08 DIAGNOSIS — R5383 Other fatigue: Secondary | ICD-10-CM | POA: Diagnosis not present

## 2017-02-08 DIAGNOSIS — R509 Fever, unspecified: Secondary | ICD-10-CM | POA: Diagnosis not present

## 2017-02-08 DIAGNOSIS — J029 Acute pharyngitis, unspecified: Secondary | ICD-10-CM | POA: Diagnosis present

## 2017-02-08 DIAGNOSIS — J36 Peritonsillar abscess: Secondary | ICD-10-CM | POA: Insufficient documentation

## 2017-02-08 DIAGNOSIS — M791 Myalgia: Secondary | ICD-10-CM | POA: Diagnosis not present

## 2017-02-08 DIAGNOSIS — B370 Candidal stomatitis: Secondary | ICD-10-CM | POA: Insufficient documentation

## 2017-02-08 HISTORY — DX: Disorder of thyroid, unspecified: E07.9

## 2017-02-08 LAB — CBC WITH DIFFERENTIAL/PLATELET
BASOS PCT: 0 %
Basophils Absolute: 0 10*3/uL (ref 0.0–0.1)
EOS ABS: 0 10*3/uL (ref 0.0–0.7)
Eosinophils Relative: 0 %
HEMATOCRIT: 41.6 % (ref 39.0–52.0)
Hemoglobin: 14.1 g/dL (ref 13.0–17.0)
Lymphocytes Relative: 6 %
Lymphs Abs: 1.2 10*3/uL (ref 0.7–4.0)
MCH: 30.9 pg (ref 26.0–34.0)
MCHC: 33.9 g/dL (ref 30.0–36.0)
MCV: 91.2 fL (ref 78.0–100.0)
MONO ABS: 2 10*3/uL — AB (ref 0.1–1.0)
MONOS PCT: 10 %
Neutro Abs: 17.4 10*3/uL — ABNORMAL HIGH (ref 1.7–7.7)
Neutrophils Relative %: 84 %
Platelets: 222 10*3/uL (ref 150–400)
RBC: 4.56 MIL/uL (ref 4.22–5.81)
RDW: 13.3 % (ref 11.5–15.5)
WBC: 20.6 10*3/uL — ABNORMAL HIGH (ref 4.0–10.5)

## 2017-02-08 LAB — BASIC METABOLIC PANEL
Anion gap: 9 (ref 5–15)
BUN: 9 mg/dL (ref 6–20)
CALCIUM: 9.1 mg/dL (ref 8.9–10.3)
CO2: 26 mmol/L (ref 22–32)
CREATININE: 1.01 mg/dL (ref 0.61–1.24)
Chloride: 101 mmol/L (ref 101–111)
GFR calc Af Amer: >60 mL/min (ref >60)
GFR calc non Af Amer: >60 mL/min (ref >60)
GLUCOSE: 121 mg/dL — AB (ref 65–99)
Potassium: 4 mmol/L (ref 3.5–5.1)
Sodium: 136 mmol/L (ref 135–145)

## 2017-02-08 LAB — RAPID STREP SCREEN (MED CTR MEBANE ONLY): STREPTOCOCCUS, GROUP A SCREEN (DIRECT): NEGATIVE

## 2017-02-08 MED ORDER — FENTANYL CITRATE (PF) 100 MCG/2ML IJ SOLN
50.0000 ug | Freq: Once | INTRAMUSCULAR | Status: AC
  Administered 2017-02-08: 50 ug via INTRAVENOUS
  Filled 2017-02-08: qty 2

## 2017-02-08 MED ORDER — CLINDAMYCIN PHOSPHATE 600 MG/50ML IV SOLN
600.0000 mg | Freq: Once | INTRAVENOUS | Status: AC
  Administered 2017-02-08: 600 mg via INTRAVENOUS
  Filled 2017-02-08: qty 50

## 2017-02-08 MED ORDER — IOPAMIDOL (ISOVUE-300) INJECTION 61%
75.0000 mL | Freq: Once | INTRAVENOUS | Status: AC | PRN
  Administered 2017-02-08: 75 mL via INTRAVENOUS

## 2017-02-08 MED ORDER — DEXAMETHASONE SODIUM PHOSPHATE 10 MG/ML IJ SOLN
10.0000 mg | Freq: Once | INTRAMUSCULAR | Status: AC
  Administered 2017-02-08: 10 mg via INTRAVENOUS
  Filled 2017-02-08: qty 1

## 2017-02-08 MED ORDER — SODIUM CHLORIDE 0.9 % IV BOLUS (SEPSIS)
1000.0000 mL | Freq: Once | INTRAVENOUS | Status: AC
  Administered 2017-02-08: 1000 mL via INTRAVENOUS

## 2017-02-08 NOTE — ED Provider Notes (Signed)
[facility]-EMERGENCY DEPT Provider Note   CSN: [id number] Arrival date & time: 02/08/17  1478     History   Chief Complaint Chief Complaint  Patient presents with  . Sore Throat  . Oral Thrush    HPI Nathan Estrada is a 56 y.o. male.  Patient is a 56 year old male who presents with sore throat. He reports a one-week history of worsening sore throat. He was seen in urgent care 4 days ago and prescribed Magic mouthwash for an ulcer in his mouth. His strep test was negative. He states since that time he feels the history is more swelling and is having difficulty swallowing. He feels like his airway swelling. He's also had some reported fevers at home. He is able to swallow his secretions. He is been using over-the-counter medications without improvement in symptoms. Is also using Magic mouthwash without improvement in symptoms. He denies any vomiting. No other rash. No chest pain or shortness of breath.      Past Medical History:  Diagnosis Date  . Thyroid disease     Patient Active Problem List   Diagnosis Date Noted  . Hypothyroidism 05/13/2014    History reviewed. No pertinent surgical history.     Home Medications    Prior to Admission medications   Medication Sig Start Date End Date Taking? Authorizing Provider  Calcium Carbonate Antacid 500 MG CAPS Take 2 capsules by mouth every evening.   Yes [provider]  Cholecalciferol (VITAMIN D) 1000 UNITS capsule Take 1,000 Units by mouth daily.   Yes [provider]  Coenzyme Q10 (COQ10) 100 MG CAPS Take 1 capsule by mouth daily.   Yes [provider]  Cyanocobalamin (B-12) 1000 MCG CAPS Take 1 capsule by mouth every evening.   Yes [provider]  DIPHENHIST 12.5 MG/5ML liquid 3 (three) times daily. Swish and spit MAGIC MOUTHWASH 02/05/17  Yes [provider]  Inositol 500 MG TABS Take 1 tablet by mouth daily.   Yes [provider]  Magnesium 500 MG CAPS Take 1 capsule  by mouth daily.   Yes [provider]  Melatonin 5 MG CAPS Take 6 capsules by mouth every evening.   Yes [provider]  UNITHROID 150 MCG tablet TAKE 1 TABLET (150 MCG TOTAL) BY MOUTH DAILY BEFORE BREAKFAST. 10/11/16  Yes Carlus Pavlov, MD  Zinc 30 MG CAPS Take 1 capsule by mouth daily.   Yes [provider]    Family History No family history on file.  Social History Social History  Substance Use Topics  . Smoking status: Never Smoker  . Smokeless tobacco: Never Used  . Alcohol use 0.0 oz/week     Allergies   Doxycycline   Review of Systems Review of Systems  Constitutional: Positive for fatigue and fever. Negative for chills and diaphoresis.  HENT: Positive for sore throat, trouble swallowing and voice change. Negative for congestion, rhinorrhea and sneezing.   Eyes: Negative.   Respiratory: Negative for cough, chest tightness and shortness of breath.   Cardiovascular: Negative for chest pain and leg swelling.  Gastrointestinal: Negative for abdominal pain, blood in stool, diarrhea, nausea and vomiting.  Genitourinary: Negative for difficulty urinating, flank pain, frequency and hematuria.  Musculoskeletal: Positive for myalgias. Negative for arthralgias and back pain.  Skin: Negative for rash.  Neurological: Negative for dizziness, speech difficulty, weakness, numbness and headaches.     Physical Exam Updated Vital Signs BP (!) 144/83   Pulse 73   Temp 98.7 F (37.1  C) (Oral)   Resp 17   Ht  (1.727 m)   Wt 70.8 kg (156 lb)   SpO2 96%   BMI 23.72 kg/m   Physical Exam  Constitutional: He is oriented to person, place, and time. He appears well-developed and well-nourished.  HENT:  Head: Normocephalic and atraumatic.  Patient has swelling of the oropharynx bilaterally. There is increased swelling of the left peritonsillar area with some deviation of the uvula to the right. No ulcers are noted. No trismus. No elevation of the  tongue.  Eyes: Pupils are equal, round, and reactive to light.  Neck: Normal range of motion. Neck supple.  Cardiovascular: Normal rate, regular rhythm and normal heart sounds.   Pulmonary/Chest: Effort normal and breath sounds normal. No respiratory distress. He has no wheezes. He has no rales. He exhibits no tenderness.  Abdominal: Soft. Bowel sounds are normal. There is no tenderness. There is no rebound and no guarding.  Musculoskeletal: Normal range of motion. He exhibits no edema.  Lymphadenopathy:    He has no cervical adenopathy.  Neurological: He is alert and oriented to person, place, and time.  Skin: Skin is warm and dry. No rash noted.  Psychiatric: He has a normal mood and affect.     ED Treatments / Results  Labs (all labs ordered are listed, but only abnormal results are displayed) Labs Reviewed  BASIC METABOLIC PANEL - Abnormal; Notable for the following:       Result Value   Glucose, Bld 121 (*)    All other components within normal limits  CBC WITH DIFFERENTIAL/PLATELET - Abnormal; Notable for the following:    WBC 20.6 (*)    Neutro Abs 17.4 (*)    Monocytes Absolute 2.0 (*)    All other components within normal limits  RAPID STREP SCREEN (NOT AT [facility])  CULTURE, GROUP A STREP [facility])    EKG  EKG Interpretation None       Radiology Ct Soft Tissue Neck W Contrast  Result Date: 02/08/2017 CLINICAL DATA:  Sore throat and stridor. Epiglottitis or tonsillitis suspected. EXAM: CT NECK WITH CONTRAST TECHNIQUE: Multidetector CT imaging of the neck was performed using the standard protocol following the bolus administration of intravenous contrast. CONTRAST:  75mL ISOVUE-300 IOPAMIDOL (ISOVUE-300) INJECTION 61% COMPARISON:  None. FINDINGS: Pharynx and larynx: There is a rim enhancing left tonsillar fossa collection measuring 22 mm. The parapharyngeal soft tissues are hazy and there is submucosal low-density expansion of the uvula, left more than right oropharynx/  hypopharynx walls, and in the left vallecula. Mild left supraglottic submucosal edema along the aryepiglottic fold. Salivary glands: Negative Thyroid: Negative Lymph nodes: Reactive nodal enlargement.  No cavitation. Vascular: No venous occlusion.  Incidental ICA tortuosity. Limited intracranial: Negative Visualized orbits: Negative Mastoids and visualized paranasal sinuses: Clear Skeleton: No acute or aggressive finding. Upper chest: Negative.  No mediastinitis or visualized pneumonia IMPRESSION: 22 mm left peritonsillar abscess. Pharyngitis with submucosal edema in the oropharynx, hypopharynx, and minimally in the left supraglottic larynx. Electronically Signed   By: Marnee Spring M.D.   On: 02/08/2017 10:35    Procedures Procedures (including critical care time)  Medications Ordered in ED Medications  dexamethasone (DECADRON) injection 10 mg (10 mg Intravenous Given 02/08/17 0830)  clindamycin (CLEOCIN) IVPB 600 mg (0 mg Intravenous Stopped 02/08/17 0900)  fentaNYL (SUBLIMAZE) injection 50 mcg (50 mcg Intravenous Given 02/08/17 0830)  sodium chloride 0.9 % bolus 1,000 mL (0 mLs Intravenous Stopped 02/08/17 0930)  iopamidol (ISOVUE-300) 61 %  injection 75 mL (75 mLs Intravenous Contrast Given 02/08/17 1013)  fentaNYL (SUBLIMAZE) injection 50 mcg (50 mcg Intravenous Given 02/08/17 1047)     Initial Impression / Assessment and Plan / ED Course  I have reviewed the triage vital signs and the nursing notes.  Pertinent labs & imaging results that were available during my care of the patient were reviewed by me and considered in my medical decision making (see chart for details).     PATIENT PRESENTS WITH WORSENING SORE THROAT AND SWELLING TO HIS THROAT. Ct SCAN SHOWS EVIDENCE OF A 2.2 CM LEFT PERITONSILLAR ABSCESS.  I spoke with Dr. Jenne Pane with ENT who requested patient come to his office at 140 this afternoon for drainage of abscess. Patient is in no current distress. He has no stridor. No evidence  of airway compromise. He doesn't like to talk to when he does talk he doesn't have any muffling or change in his voice. He's handling his secretions without difficulty. He was given Decadron and clindamycin in the emergency department. Family was explained the plan and is good with this plan. He'll be discharged home to follow-up with Dr. Jenne Pane at 140 this afternoon. He was advised not to eat or drink prior to this.  Final Clinical Impressions(s) / ED Diagnoses   Final diagnoses:  Peritonsillar abscess    New Prescriptions New Prescriptions   No medications on file     Rolan Bucco, MD 02/08/17 1132

## 2017-02-08 NOTE — ED Triage Notes (Signed)
Patient reports oral thrush with sore throat / swelling onset Sunday unrelieved by Magic Mouthwash prescribed at an urgent care , low last night. grade fever

## 2017-02-10 LAB — CULTURE, GROUP A STREP (THRC)

## 2017-10-10 ENCOUNTER — Ambulatory Visit (INDEPENDENT_AMBULATORY_CARE_PROVIDER_SITE_OTHER): Payer: BC Managed Care – PPO | Admitting: Internal Medicine

## 2017-10-10 ENCOUNTER — Encounter: Payer: Self-pay | Admitting: Internal Medicine

## 2017-10-10 VITALS — BP 124/82 | HR 73 | Ht 68.0 in | Wt 166.0 lb

## 2017-10-10 DIAGNOSIS — R7309 Other abnormal glucose: Secondary | ICD-10-CM | POA: Diagnosis not present

## 2017-10-10 DIAGNOSIS — E063 Autoimmune thyroiditis: Secondary | ICD-10-CM | POA: Diagnosis not present

## 2017-10-10 DIAGNOSIS — E038 Other specified hypothyroidism: Secondary | ICD-10-CM | POA: Diagnosis not present

## 2017-10-10 LAB — T4, FREE: Free T4: 1.37 ng/dL (ref 0.60–1.60)

## 2017-10-10 LAB — TSH: TSH: 2.67 u[IU]/mL (ref 0.35–4.50)

## 2017-10-10 NOTE — Patient Instructions (Signed)
Please stop at the lab.  Please continue Unithroid 150 mcg daily.  Take the thyroid hormone every day, with water, at least 30 minutes before breakfast, separated by at least 4 hours from: - acid reflux medications - calcium - iron - multivitamins  Please return in 1 year.  

## 2017-10-10 NOTE — Progress Notes (Signed)
Patient ID: Nathan Estrada, male   DOB: 02-04-1961, 57 y.o.   MRN: [id number]  HPI  Nathan Estrada is a 57 y.o.-year-old male, returning for f/u for Hashimoto's hypothyroidism, dx 1998. Last visit 1 year ago.  He had a peritonsillar abscess in 01/2017 >> Prednisone, ABx. Saw ENT afterwards >> lanced.  Last June he had a HBA1c of 5.8% by PCP. Since then, he cut out added sugars, increased animal protein. He lost weight and feeling better.  He has another check by PCP in June.  He decreased melatonin from 30 mg to 3 mg since last visit after our discussion.  He read that increase melatonin can suppress insulin production so is grateful that I advised him to do so.  Pt is on levothyroxine (Unithroid d.a.w.) 150 Mcg daily, taken: - in am - fasting  - With water - Drinks coffee with half-and-half right after levothyroxine every morning - at least 30 min from b'fast - no Fe,  PPIs - + MVI later in the day - + Occasional Tums later in the day - not on Biotin  He tried Levothyroxine and Synthroid >> fatigue, fluctuating TFTs.  He was previously on biotin, now off.  I reviewed his TFTs: Lab Results  Component Value Date   TSH 2.23 01/03/2017   TSH 2.42 10/10/2016   TSH 0.67 10/11/2015   TSH 0.68 01/22/2015   TSH 0.41 10/20/2014   FREET4 1.54 01/03/2017   FREET4 1.41 10/10/2016   FREET4 1.43 10/11/2015   FREET4 1.53 01/22/2015   FREET4 1.43 10/20/2014  06/03/2013: TSH 5.5, TPO Abs 202   He has a diagnosis of Hashimoto's thyroiditis based on elevated TPO antibodies: Component     Latest Ref Rng & Units 01/22/2015  Thyroperoxidase Ab SerPl-aCnc     <9 IU/mL 201 (H)  TPO Abs 1479 at dx  Pt denies: - feeling nodules in neck - hoarseness - dysphagia - choking - SOB with lying down  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  He competes in [employer]. He was at the [facility] >> 5th place at 400 m.  ROS: Constitutional: +  Intentional weight loss, + fatigue after exercise, no subjective hyperthermia, no subjective hypothermia, + nocturia 2-3 times a night Eyes: no blurry vision, no xerophthalmia ENT: no sore throat, + see HPI Cardiovascular: no CP/no SOB/no palpitations/no leg swelling Respiratory: no cough/no SOB/no wheezing Gastrointestinal: no N/no V/no D/no C/no acid reflux Musculoskeletal: no muscle aches/no joint aches Skin: no rashes, no hair loss Neurological: no tremors/no numbness/no tingling/no dizziness  I reviewed pt's medications, allergies, PMH, social hx, family hx, and changes were documented in the history of present illness. Otherwise, unchanged from my initial visit note.  Past medical history: - Mild hyperlipidemia - Dysthymia  Past surgical history: - None  History   Social History  . Marital Status: Married    Spouse Name: N/A    Number of Children: 5   Occupational History  . Engineer/counselor/student   Social History Main Topics  . Smoking status: Never Smoker   . Smokeless tobacco: No  . Alcohol Use: Yes, 1-2 drinks once a week: Beer and mixed drinks  . Drug Use: No  For exercise, he is walking, running, doing strength exercises, yoga 3-4 times a week  Current Outpatient Medications  Medication Sig Dispense Refill  . Calcium Carbonate Antacid 500 MG CAPS Take 2 capsules by mouth every evening.    Marland Kitchen  Cholecalciferol (VITAMIN D) 1000 UNITS capsule Take 1,000 Units by mouth daily.    . Coenzyme Q10 (COQ10) 100 MG CAPS Take 1 capsule by mouth daily.    . Cyanocobalamin (B-12) 1000 MCG CAPS Take 1 capsule by mouth every evening.    Marland Kitchen DIPHENHIST 12.5 MG/5ML liquid 3 (three) times daily. Swish and spit MAGIC MOUTHWASH    . Inositol 500 MG TABS Take 1 tablet by mouth daily.    . Magnesium 500 MG CAPS Take 1 capsule by mouth daily.    . Melatonin 5 MG CAPS Take 6 capsules by mouth every evening.    Marland Kitchen UNITHROID 150 MCG tablet TAKE 1 TABLET (150 MCG TOTAL) BY MOUTH DAILY  BEFORE BREAKFAST. 90 tablet 3  . Zinc 30 MG CAPS Take 1 capsule by mouth daily.     No current facility-administered medications for this visit.    Allergies  Allergen Reactions  . Doxycycline Rash   Family history: See history of present illness +: - diabetes: Paternal grandmother - hypertension: Father - Hyperlipidemia: Father - Cancer: Father and grandfather (prostate)  PE: BP 124/82   Pulse 73   Ht  (1.727 m)   Wt 166 lb (75.3 kg)   SpO2 98%   BMI 25.24 kg/m  Body mass index is 25.24 kg/m.  Wt Readings from Last 3 Encounters:  10/10/17 166 lb (75.3 kg)  02/08/17 156 lb (70.8 kg)  10/10/16 167 lb (75.8 kg)   Constitutional: Normal weight, in NAD Eyes: PERRLA, EOMI, no exophthalmos ENT: moist mucous membranes, no thyromegaly, no cervical lymphadenopathy Cardiovascular: RRR, No MRG Respiratory: CTA B Gastrointestinal: abdomen soft, NT, ND, BS+ Musculoskeletal: + deformities (Dupuytren contraction right fifth finger), strength intact in all 4 Skin: moist, warm, no rashes Neurological: no tremor with outstretched hands, DTR normal in all 4  ASSESSMENT: 1. Hypothyroidism - 2/2 Hashimoto's thyroidism  2.  Elevated HbA1c  PLAN:  1. Patient with long-standing hypothyroidism, on levothyroxine therapy (Unithroid d.a.w.), with good control. - latest thyroid labs reviewed with pt >> normal  - he continues on LT4 150 mcg daily - pt feels good on this dose. - we discussed about taking the thyroid hormone every day, with water, >30 minutes before breakfast, separated by >4 hours from acid reflux medications, calcium, iron, multivitamins. Pt. is taking it correctly.  At last visit, he was taking his multivitamin only 1 to 2 hours after breakfast so I advised him to move his multivitamins before lunch or later.  He was also drinking coffee with half-and-half immediately after levothyroxine, but he does do this every morning so we did not change this.   - will check thyroid  tests today: TSH and fT4 - If labs are abnormal, he will need to return for repeat TFTs in 1.5 months  2.  Elevated HbA1c - new to me - He had a slightly elevated HbA1c last year after which he made several changes: He decrease his melatonin dose to now fairly physiologic 3 mg a day and he also change his diet.  He is eating more protein and cut down carbs.  He lost weight intentionally and he is feeling better.  He is also exercising more and participating in [employer] athletics. - We discussed about diet and I made several suggestions, especially reducing animal protein which is conducive to insulin resistance and cellular aging.   - He will have another HbA1c checked by PCP next month - We discussed that at these low levels of HbA1c,  the assay is not very accurate   Needs refills  Office Visit on 10/10/2017  Component Date Value Ref Range Status  . TSH 10/10/2017 2.67  0.35 - 4.50 uIU/mL Final  . Free T4 10/10/2017 1.37  0.60 - 1.60 ng/dL Final   Comment: Specimens from patients who are undergoing biotin therapy and /or ingesting biotin supplements may contain high levels of biotin.  The higher biotin concentration in these specimens interferes with this Free T4 assay.  Specimens that contain high levels  of biotin may cause false high results for this Free T4 assay.  Please interpret results in light of the total clinical presentation of the patient.     Normal TFTs.  Nathan Pavlov, MD PhD [facility] Endocrinology

## 2017-10-11 MED ORDER — UNITHROID 150 MCG PO TABS: ORAL_TABLET | ORAL | 3 refills | Status: DC

## 2017-10-22 IMAGING — CT CT NECK W/ CM
4 of 6 series · 13 of 33 positions shown, 15 images · IV contrast (Omni 300)
Comparison: None.

CLINICAL DATA: Sore throat and stridor. Epiglottitis or tonsillitis
suspected.

EXAM:
CT NECK WITH CONTRAST
TECHNIQUE: Multidetector CT imaging of the neck was performed using the
standard protocol following the bolus administration of intravenous
contrast.
CONTRAST:  75mL FPIRM0-R22 IOPAMIDOL (FPIRM0-R22) INJECTION 61%

[Series 3: neck 2.0 st · axial · 0.34mm/px · z∈[-320,-200]mm · 3 of 120 slices shown, 4 images (1 of 3)]
[im 30/120  soft-tissue]
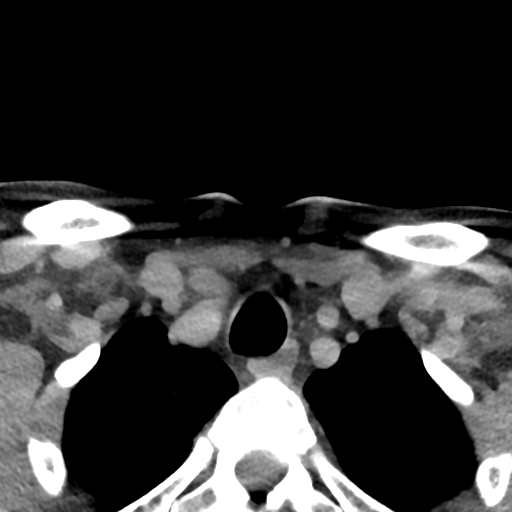
[im 30/120  bone]
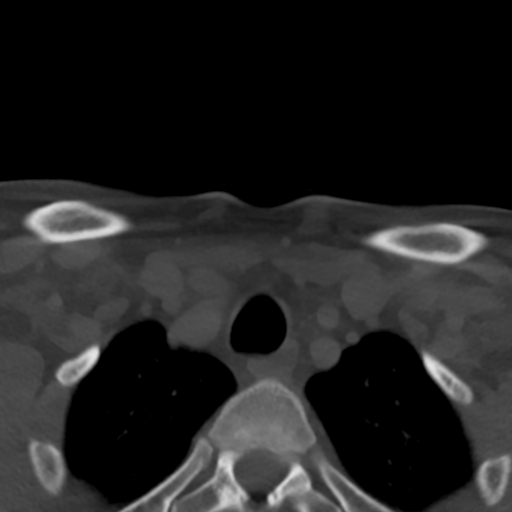
[im 60/120  bone]
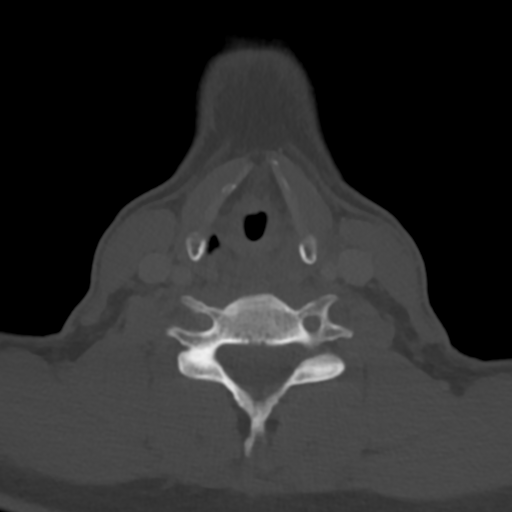
[im 90/120  bone]
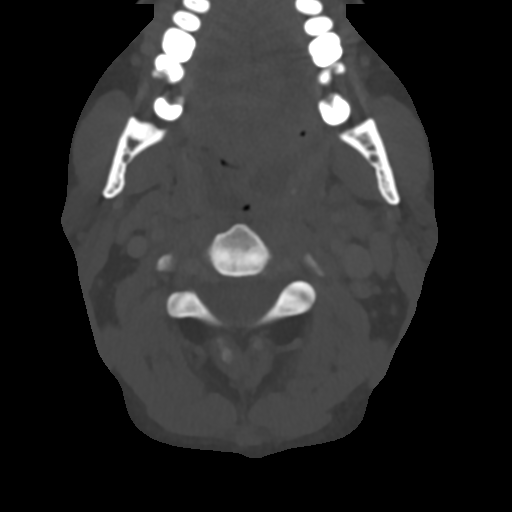

[Series 5: neck 2.0 st · sagittal · 0.35mm/px · 5 of 77 slices shown, 6 images (2 of 3)]
[im 26/77  bone]
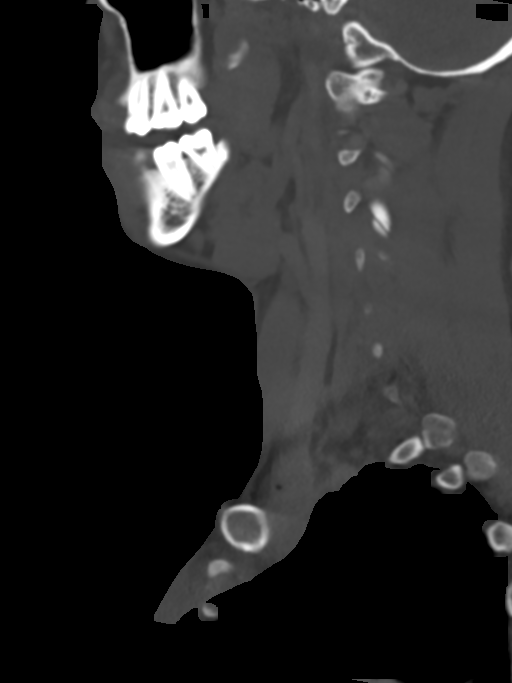
[im 32/77  bone]
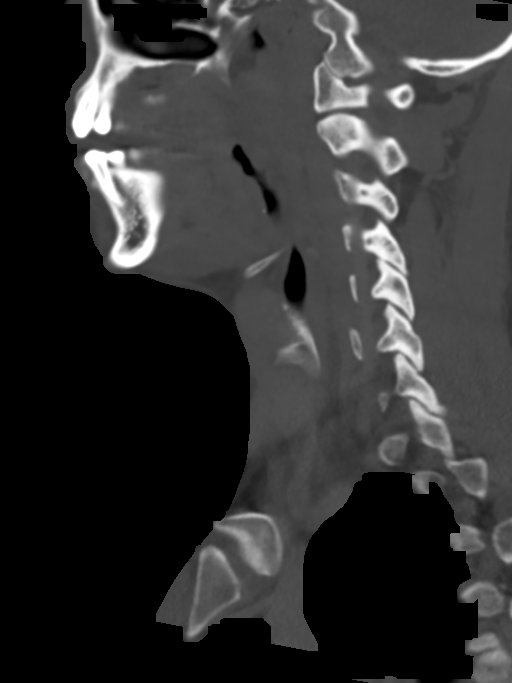
[im 39/77  soft-tissue]
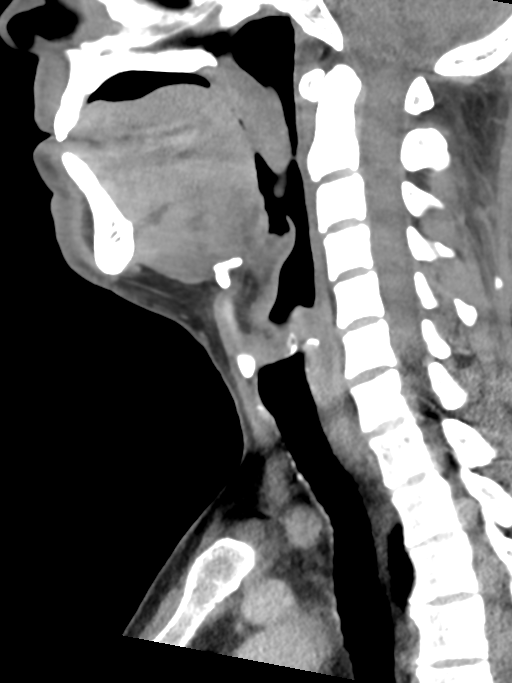
[im 39/77  bone]
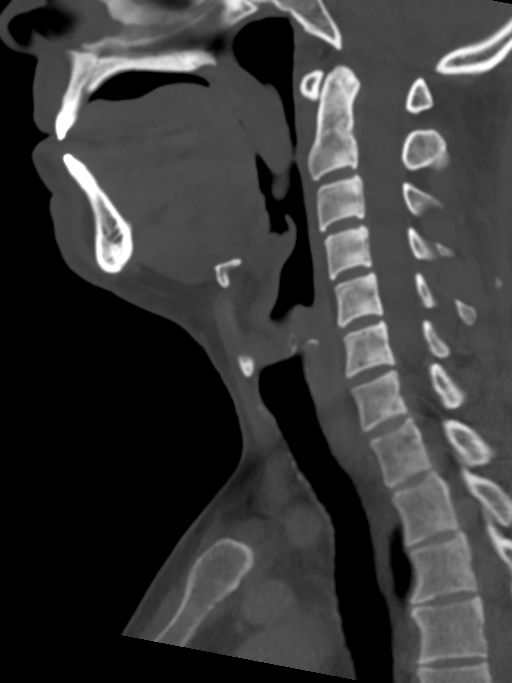
[im 45/77  bone]
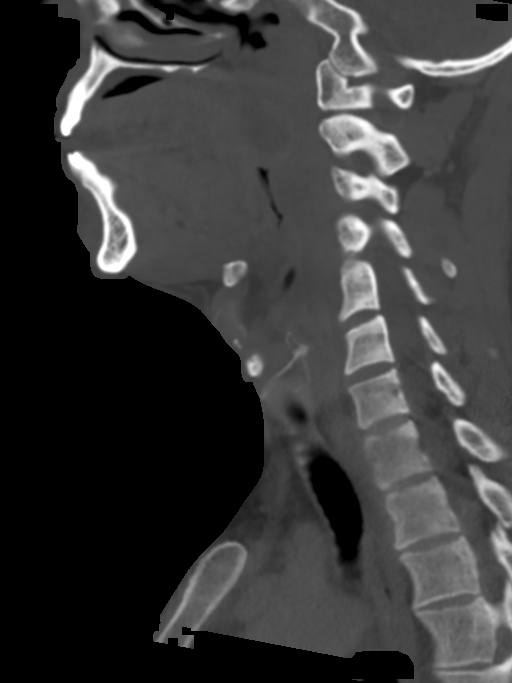
[im 51/77  bone]
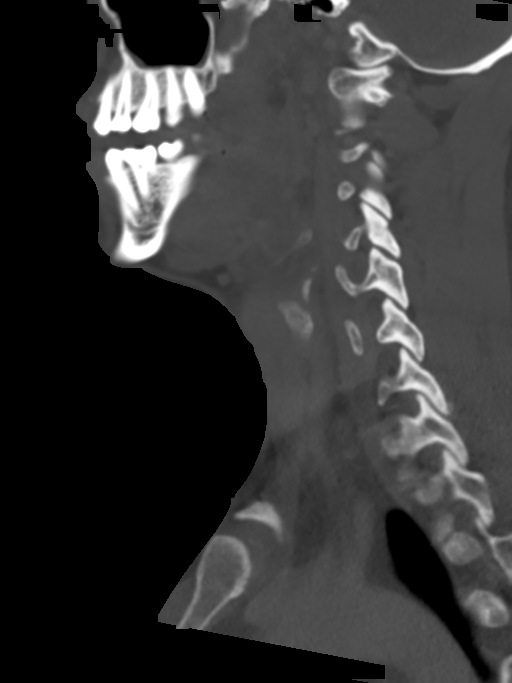

[Series 6: neck 2.0 st · coronal · 0.33mm/px · 3 of 99 slices shown (3 of 3)]
[im 20/99  bone]
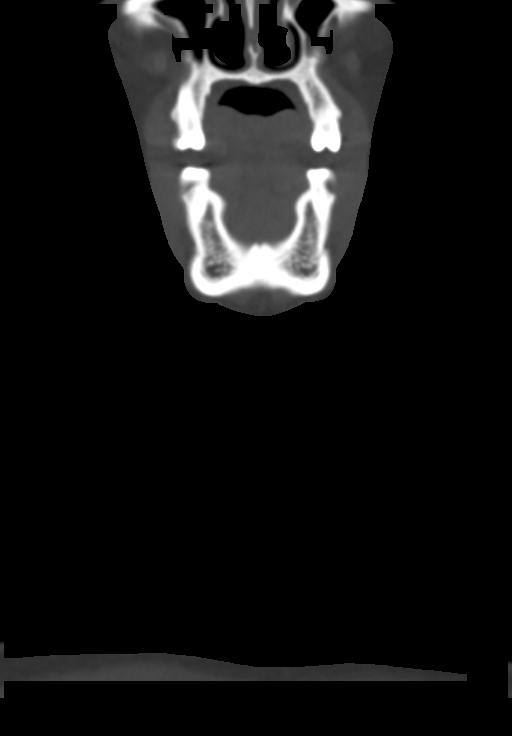
[im 40/99  bone]
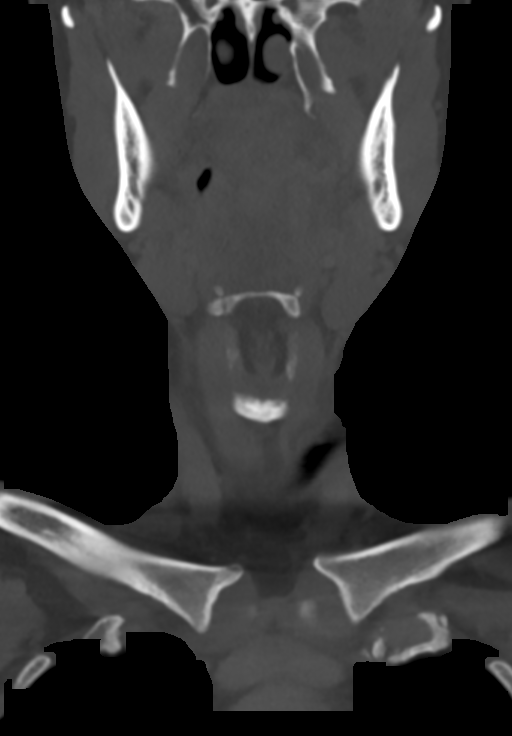
[im 59/99  bone]
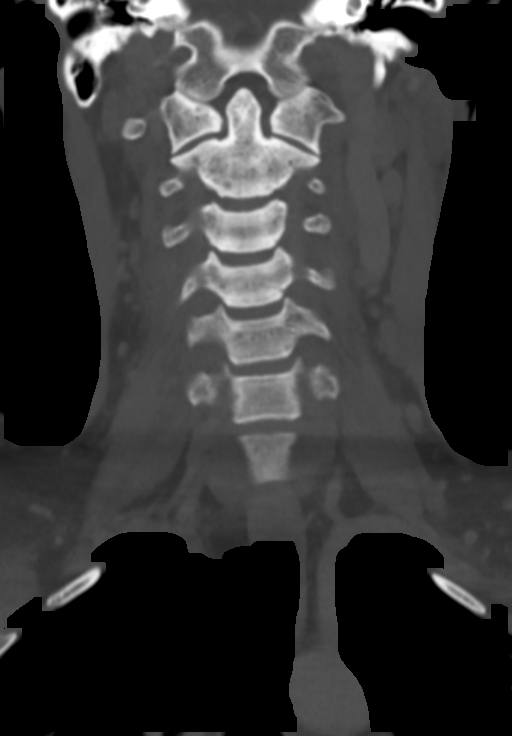

[Series 7: neck 2.0 st orthogonal · axial · 0.29mm/px · z∈[-347,-290]mm · 2 of 119 slices shown]
[im 30/119  bone]
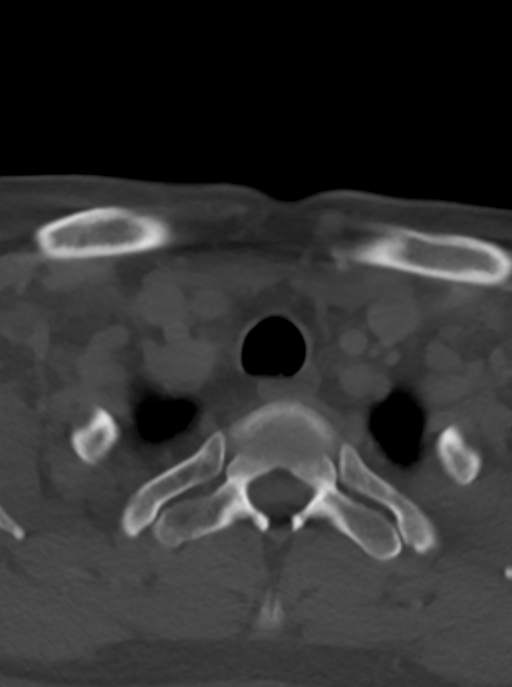
[im 60/119  bone]
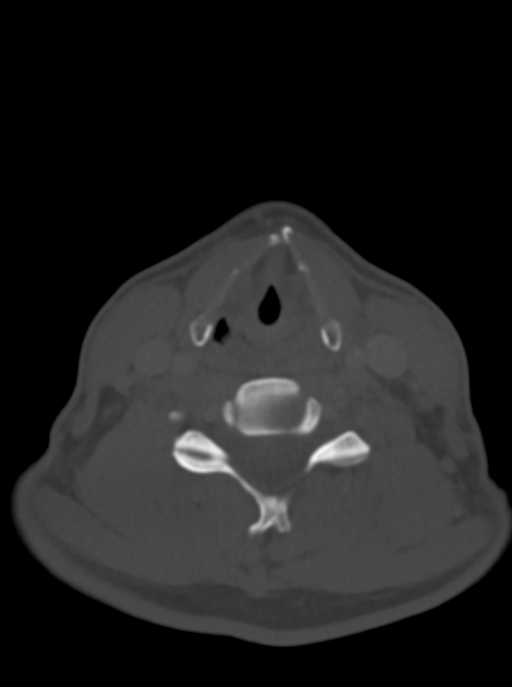

[13 of 33 positions shown; findings below may reference images not displayed]

FINDINGS: Pharynx and larynx: There is a rim enhancing left tonsillar fossa
collection measuring 22 mm. The parapharyngeal soft tissues are hazy
and there is submucosal low-density expansion of the uvula, left
more than right oropharynx/ hypopharynx walls, and in the left
vallecula. Mild left supraglottic submucosal edema along the
aryepiglottic fold.

Salivary glands: Negative

Thyroid: Negative

Lymph nodes: Reactive nodal enlargement.  No cavitation.

Vascular: No venous occlusion.  Incidental ICA tortuosity.

Limited intracranial: Negative

Visualized orbits: Negative

Mastoids and visualized paranasal sinuses: Clear

Skeleton: No acute or aggressive finding.

Upper chest: Negative.  No mediastinitis or visualized pneumonia
IMPRESSION: 22 mm left peritonsillar abscess. Pharyngitis with submucosal edema
in the oropharynx, hypopharynx, and minimally in the left
supraglottic larynx.

## 2018-03-25 ENCOUNTER — Other Ambulatory Visit: Payer: Self-pay | Admitting: Internal Medicine

## 2018-03-25 ENCOUNTER — Encounter: Payer: Self-pay | Admitting: Internal Medicine

## 2018-03-25 DIAGNOSIS — E038 Other specified hypothyroidism: Secondary | ICD-10-CM

## 2018-03-25 DIAGNOSIS — E063 Autoimmune thyroiditis: Principal | ICD-10-CM

## 2018-04-22 ENCOUNTER — Other Ambulatory Visit (INDEPENDENT_AMBULATORY_CARE_PROVIDER_SITE_OTHER): Payer: BC Managed Care – PPO

## 2018-04-22 DIAGNOSIS — E038 Other specified hypothyroidism: Secondary | ICD-10-CM | POA: Diagnosis not present

## 2018-04-22 DIAGNOSIS — E063 Autoimmune thyroiditis: Secondary | ICD-10-CM

## 2018-04-22 LAB — T4, FREE: Free T4: 1.23 ng/dL (ref 0.60–1.60)

## 2018-04-22 LAB — TSH: TSH: 0.62 u[IU]/mL (ref 0.35–4.50)

## 2018-10-08 ENCOUNTER — Other Ambulatory Visit: Payer: Self-pay

## 2018-10-09 ENCOUNTER — Ambulatory Visit: Payer: BC Managed Care – PPO | Admitting: Internal Medicine

## 2018-10-09 ENCOUNTER — Encounter: Payer: Self-pay | Admitting: Internal Medicine

## 2018-10-09 VITALS — BP 110/60 | HR 61 | Temp 98.1°F | Ht 68.0 in | Wt 168.0 lb

## 2018-10-09 DIAGNOSIS — E038 Other specified hypothyroidism: Secondary | ICD-10-CM | POA: Diagnosis not present

## 2018-10-09 DIAGNOSIS — R5383 Other fatigue: Secondary | ICD-10-CM | POA: Diagnosis not present

## 2018-10-09 DIAGNOSIS — E063 Autoimmune thyroiditis: Secondary | ICD-10-CM

## 2018-10-09 DIAGNOSIS — R7309 Other abnormal glucose: Secondary | ICD-10-CM | POA: Diagnosis not present

## 2018-10-09 LAB — T4, FREE: Free T4: 1.21 ng/dL (ref 0.60–1.60)

## 2018-10-09 LAB — TSH: TSH: 5.94 u[IU]/mL — ABNORMAL HIGH (ref 0.35–4.50)

## 2018-10-09 LAB — POCT GLYCOSYLATED HEMOGLOBIN (HGB A1C): Hemoglobin A1C: 5.4 % (ref 4.0–5.6)

## 2018-10-09 NOTE — Patient Instructions (Signed)
Please stop at the lab.  Please continue Unithroid 150 mcg daily.  Take the thyroid hormone every day, with water, at least 30 minutes before breakfast, separated by at least 4 hours from: - acid reflux medications - calcium - iron - multivitamins  Please return in 1 year.

## 2018-10-09 NOTE — Progress Notes (Addendum)
Patient ID: Nathan Estrada, male   DOB: 1960-10-19, 58 y.o.   MRN: [id number]  HPI  Nathan Estrada is a 58 y.o.-year-old male, returning for f/u for Hashimoto's hypothyroidism, dx 1998. Last visit 1 year ago.  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  A bone scan showed other lesions in pelvis and scalp.  She has no pain right now and he continues to run.  In 10/2016, he had a slightly elevated HbA1c of 5.8%, as checked per PCP.  Since then, he cut out added sugars, increased animal protein.  He lost weight and was feeling better.  However, he developed fatigue afterwards so he started to complex carbs back.  He cut down red meat to only twice a months.  He feels much better now.  Pt is on Unithroid d.a.w. 150 Mcg daily, taken: - in am - fasting - drinks coffee with half-and-half right after levothyroxine every morning - at least 30 min from b'fast - no Fe, PPIs - +  MVI later in the day  - + liquid calcium + magnesium + vit D at night - not on Biotin  He also tried levothyroxine and Synthroid >> fatigue, fluctuating TFTs.  Since our last visit, he started l-carnitine to help with energy.  She sent me a message to see if this would influence TFTs.  I advised him that it could so we had him back for repeat TFTs in 04/2018.  These were normal.  He continues on l-carnitine  I reviewed his TFTs: Lab Results  Component Value Date   TSH 0.62 04/22/2018   TSH 2.67 10/10/2017   TSH 2.23 01/03/2017   TSH 2.42 10/10/2016   TSH 0.67 10/11/2015   FREET4 1.23 04/22/2018   FREET4 1.37 10/10/2017   FREET4 1.54 01/03/2017   FREET4 1.41 10/10/2016   FREET4 1.43 10/11/2015  06/03/2013: TSH 5.5, TPO Abs 202   He has a diagnosis of Hashimoto's thyroiditis based on elevated TPO antibodies Component     Latest Ref Rng & Units 01/22/2015  Thyroperoxidase Ab SerPl-aCnc     <9 IU/mL 201 (H)  TPO Abs 1479 at dx  Pt denies: - feeling nodules in  neck - hoarseness - dysphagia - choking - SOB with lying down  He had a peritonsillar abscess in 01/2017 >> Prednisone, ABx. Saw ENT afterwards >> lanced.  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  However, this did not work very well to working as an Art gallery manager.  At last visit, he was competing in [employer]. He was at the [facility] >> 5th place at 400 m.  ROS: Constitutional: no weight gain/no weight loss, no fatigue, no subjective hyperthermia, no subjective hypothermia Eyes: no blurry vision, no xerophthalmia ENT: no sore throat,  + see HPI Cardiovascular: no CP/no SOB/no palpitations/no leg swelling Respiratory: no cough/no SOB/no wheezing Gastrointestinal: no N/no V/no D/no C/no acid reflux Musculoskeletal: no muscle aches/no joint aches Skin: no rashes, no hair loss Neurological: no tremors/no numbness/no tingling/no dizziness  I reviewed pt's medications, allergies, PMH, social hx, family hx, and changes were documented in the history of present illness. Otherwise, unchanged from my initial visit note.  Past medical history: - Mild hyperlipidemia - Dysthymia  Past surgical history: - None  History   Social History  . Marital Status: Married    Spouse Name: N/A    Number of Children: 5  Occupational History  . Engineer/counselor/student   Social History Main Topics  . Smoking status: Never Smoker   . Smokeless tobacco: No  . Alcohol Use: Yes, 1-2 drinks once a week: Beer and mixed drinks  . Drug Use: No  For exercise, he is walking, running, doing strength exercises, yoga 3-4 times a week  Current Outpatient Medications  Medication Sig Dispense Refill  . Calcium Carbonate Antacid 500 MG CAPS Take 2 capsules by mouth every evening.    . Cholecalciferol (VITAMIN D) 1000 UNITS capsule Take 1,000 Units by mouth daily.    . Coenzyme Q10 (COQ10) 100 MG CAPS Take 1 capsule by mouth daily.    . Cyanocobalamin (B-12)  1000 MCG CAPS Take 1 capsule by mouth every evening.    . Inositol 500 MG TABS Take 1 tablet by mouth daily.    . Magnesium 500 MG CAPS Take 1 capsule by mouth daily.    . Melatonin 3 MG TABS Take 1 tablet by mouth daily.    . Multiple Vitamin (MULTIVITAMIN) tablet Take 0.5 tablets by mouth daily.    Marland Kitchen. thiamine (VITAMIN B-1) 50 MG tablet Take 50 mg by mouth daily.    Marland Kitchen. UNITHROID 150 MCG tablet TAKE 1 TABLET (150 MCG TOTAL) BY MOUTH DAILY BEFORE BREAKFAST. 90 tablet 3  . Zinc 30 MG CAPS Take 1 capsule by mouth daily.     No current facility-administered medications for this visit.    Allergies  Allergen Reactions  . Doxycycline Rash   Family history: See history of present illness +: - diabetes: Paternal grandmother - hypertension: Father - Hyperlipidemia: Father - Cancer: Father and grandfather (prostate)  PE: BP 110/60   Pulse 61   Temp 98.1 F (36.7 C)   Ht 5\' 8"  (1.727 m)   Wt 168 lb (76.2 kg)   SpO2 99%   BMI 25.54 kg/m  Body mass index is 25.54 kg/m.  Wt Readings from Last 3 Encounters:  10/09/18 168 lb (76.2 kg)  10/10/17 166 lb (75.3 kg)  02/08/17 156 lb (70.8 kg)   Constitutional: normal weight, in NAD Eyes: PERRLA, EOMI, no exophthalmos ENT: moist mucous membranes, no thyromegaly, no cervical lymphadenopathy Cardiovascular: RRR, No MRG Respiratory: CTA B Gastrointestinal: abdomen soft, NT, ND, BS+ Musculoskeletal: + deformities (Dupuytren contraction right fifth finger), strength intact in all 4 Skin: moist, warm, no rashes Neurological: no tremor with outstretched hands, DTR normal in all 4  ASSESSMENT: 1. Hypothyroidism - 2/2 Hashimoto's thyroidism  2.  Elevated HbA1c  3.  Fatigue  PLAN:  1. Patient with longstanding hypothyroidism, on levothyroxine d.a.w. (Unithroid), with good control.  We tried levothyroxine generic and Synthroid in the past but he could not tolerate these well due to fatigue and he also had fluctuating TFTs.  However, he  tolerates Unithroid very well. - latest thyroid labs reviewed with pt >> normal 04/2018 - he continues on Unithroid 150 mcg daily - pt feels good on this dose. - we discussed about taking the thyroid hormone every day, with water, >30 minutes before breakfast, separated by >4 hours from acid reflux medications, calcium, iron, multivitamins. Pt. is taking it correctly.  Drink coffee with half-and-half immediately after levothyroxine, but he does this every morning, so we will continue this.  This is most likely the reason why he is requiring a higher dose of Unithroid than expected. - will check thyroid tests today: TSH and fT4 - If labs are abnormal, he will need to return for repeat TFTs in  1.5 months  2.  Elevated HbA1c -Patient had a slightly elevated HbA1c last year (5.8%), after which she made several changes: Decrease his melatonin to a more physiologic 3 mg a day and also changed his diet.  He started to eat more protein and cut down carbs.  He lost weight intentionally and he was feeling better.  He also started to exercise more and participated in [employer] athletics.  At last visit we discussed about reducing animal protein which was conducive to insulin resistance and cellular aging.  At that time, he had an HbA1c pending with PCP in the next month so we did not recheck it.  At this visit, patient reports that the A1c from last year was 5.7%. - today, HbA1c is 5.4% (improved)  3. Fatigue -Continues on l-carnitine, which helps -Also, he started to feel much better after he decreased intake of meat and increased starches -Continue to exercise (running) without increased exertional fatigue -We will check TFTs today to see if the dose of his Unithroid needs to be adjusted  Needs refills  Component     Latest Ref Rng & Units 10/09/2018  TSH     0.35 - 4.50 uIU/mL 5.94 (H)  T4,Free(Direct)     0.60 - 1.60 ng/dL 1.32  Hemoglobin G4W     4.0 - 5.6 % 5.4  TSH higher than before,  unexpectedly.  I will check with the patient to see if he remembers changing how he takes the medication , although we reviewed this at the time of the visit  Msg from pt: Dr. Elvera Lennox, I can think of 3 variables that have changed: 1) I increased the L-carnitine from 1gm to 1.5gm, 2) I've increased my training work load about 5 hours per week to 6 or 7 hours a week (70% to 80% low intensity), and 3) I've lost about 4 to 5 lbs of fat and gained 4 to 5 lbs of lean body mass (21%BF to 18%BF).  I have experienced some fatigue, but attributed that mostly to the increased training. I feel good overall. I will cut back to 1 gm on the L-carnitine, and come back at end of June. If it improves, I will reconsider the L-carnitine. Thanks, Estill Bakes, MD PhD [facility] Endocrinology

## 2018-10-10 ENCOUNTER — Encounter: Payer: Self-pay | Admitting: Internal Medicine

## 2018-10-11 ENCOUNTER — Other Ambulatory Visit: Payer: Self-pay | Admitting: Internal Medicine

## 2018-10-11 NOTE — Addendum Note (Signed)
Addended by: Carlus Pavlov on: 10/11/2018 04:31 PM   Modules accepted: Orders

## 2018-11-15 ENCOUNTER — Encounter: Payer: Self-pay | Admitting: Internal Medicine

## 2018-11-15 ENCOUNTER — Other Ambulatory Visit: Payer: Self-pay

## 2018-11-15 ENCOUNTER — Ambulatory Visit: Payer: BC Managed Care – PPO | Admitting: Internal Medicine

## 2018-11-15 ENCOUNTER — Other Ambulatory Visit (INDEPENDENT_AMBULATORY_CARE_PROVIDER_SITE_OTHER): Payer: BC Managed Care – PPO

## 2018-11-15 DIAGNOSIS — E063 Autoimmune thyroiditis: Secondary | ICD-10-CM

## 2018-11-15 DIAGNOSIS — E038 Other specified hypothyroidism: Secondary | ICD-10-CM | POA: Diagnosis not present

## 2018-11-15 LAB — T4, FREE: Free T4: 1.09 ng/dL (ref 0.60–1.60)

## 2018-11-15 LAB — TSH: TSH: 3.64 u[IU]/mL (ref 0.35–4.50)

## 2018-12-15 ENCOUNTER — Encounter: Payer: Self-pay | Admitting: Internal Medicine

## 2019-10-02 ENCOUNTER — Other Ambulatory Visit: Payer: Self-pay | Admitting: Internal Medicine

## 2019-10-13 ENCOUNTER — Other Ambulatory Visit: Payer: Self-pay

## 2019-10-15 ENCOUNTER — Other Ambulatory Visit: Payer: Self-pay

## 2019-10-15 ENCOUNTER — Ambulatory Visit: Payer: BC Managed Care – PPO | Admitting: Internal Medicine

## 2019-10-15 ENCOUNTER — Encounter: Payer: Self-pay | Admitting: Internal Medicine

## 2019-10-15 VITALS — BP 142/98 | HR 49 | Ht 68.0 in | Wt 161.0 lb

## 2019-10-15 DIAGNOSIS — E063 Autoimmune thyroiditis: Secondary | ICD-10-CM | POA: Diagnosis not present

## 2019-10-15 DIAGNOSIS — E038 Other specified hypothyroidism: Secondary | ICD-10-CM

## 2019-10-15 DIAGNOSIS — R7309 Other abnormal glucose: Secondary | ICD-10-CM | POA: Diagnosis not present

## 2019-10-15 LAB — T4, FREE: Free T4: 1.18 ng/dL (ref 0.60–1.60)

## 2019-10-15 LAB — HEMOGLOBIN A1C: Hgb A1c MFr Bld: 5.6 % (ref 4.6–6.5)

## 2019-10-15 LAB — TSH: TSH: 4.64 u[IU]/mL — ABNORMAL HIGH (ref 0.35–4.50)

## 2019-10-15 NOTE — Progress Notes (Addendum)
-Patient ID: Nathan Estrada, male   DOB: 1961-03-08, 59 y.o.   MRN: [id number]   This visit occurred during the SARS-CoV-2 public health emergency.  Safety protocols were in place, including screening questions prior to the visit, additional usage of staff PPE, and extensive cleaning of exam room while observing appropriate contact time as indicated for disinfecting solutions.   HPI  Nathan Estrada is a 59 y.o.-year-old male, returning for f/u for Hashimoto's hypothyroidism, dx 1998, and elevated HbA1c. Last visit 1 year ago.  Elevated HbA1c:  In 10/2016, he had a slightly elevated HbA1c of 5.8%, as checked per PCP.  A repeat HbA1c was 5.7%.  Since then, he cut out added sugars, increased animal protein.  He lost weight and was feeling better.  However, he developed fatigue afterwards so he started to complex carbs back.  He cut down red meat to only twice a month.  He felt much better at last visit.  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  He was wondering whether he needed T3 supplementation.  I was out of the office with my colleague send him a message that this is not needed in the setting of a normal TSH.  He is on Unithroid d.a.w. 150 mcg daily, taken: - in am - fasting - drinks coffee with half-and-half immediately after taking Unithroid - at least 30 min from b'fast -+ Liquid Ca at night - no Fe, PPIs - + MVI later in the day - not on Biotin  She also tried levothyroxine and Synthroid but had fatigue and fluctuating TFTs.  At our last visit, he was on l-carnitine to help with energy.  His TSH returned slightly high.  He stopped the supplement and TSH normalized.  Since last visit, he tried alpha lipoic acid and B1 >>  dizziness, fogginess >> now off.  Reviewed his TFTs:: Lab Results  Component Value Date   TSH 3.64 11/15/2018   TSH 5.94 (H) 10/09/2018   TSH 0.62 04/22/2018   TSH 2.67 10/10/2017   TSH 2.23 01/03/2017   FREET4 1.09 11/15/2018   FREET4 1.21 10/09/2018   FREET4 1.23 04/22/2018   FREET4 1.37 10/10/2017   FREET4 1.54 01/03/2017  06/03/2013: TSH 5.5, TPO Abs 202   He had elevated TPO antibodies: Component     Latest Ref Rng & Units 01/22/2015  Thyroperoxidase Ab SerPl-aCnc     <9 IU/mL 201 (H)  TPO Abs 1479 at dx  Pt denies: - feeling nodules in neck - hoarseness - dysphagia - choking - SOB with lying down  He had a peritonsillar abscess in 01/2017 >> Prednisone, ABx. Saw ENT afterwards >> lanced.  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  In 2019, he was competing in [employer]. He was at the [facility] >> 5th place at 400 m. 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  A bone scan showed other lesions in pelvis and scalp.    He hurt his foot in fall 2020 so he is now not running  as much, but doing strength training.  He was able to lose a significant amount of fat in a muscle.  ROS: Constitutional: no weight gain/+ weight loss, no fatigue, no subjective hyperthermia, no subjective hypothermia Eyes: no blurry vision, no xerophthalmia ENT: no sore throat, + see HPI Cardiovascular: no CP/no SOB/no palpitations/no leg swelling Respiratory: no cough/no SOB/no wheezing Gastrointestinal: no N/no V/no D/no C/no acid reflux Musculoskeletal: no muscle aches/no joint aches Skin: no rashes, no hair loss Neurological: no tremors/no numbness/no tingling/no dizziness  I reviewed pt's medications, allergies, PMH, social hx, family hx, and changes were documented in the history of present illness. Otherwise, unchanged from my  initial visit note.  Past medical history: - Mild hyperlipidemia - Dysthymia  Past surgical history: - None  History   Social History  . Marital Status: Married    Spouse Name: N/A    Number of Children: 5   Occupational History  . Engineer/counselor/student   Social History Main Topics  . Smoking status: Never Smoker   . Smokeless tobacco: No  . Alcohol Use: Yes, 1-2 drinks once a week: Beer and mixed drinks  . Drug Use: No  For exercise, he is walking, running, doing strength exercises, yoga 3-4 times a week  Current Outpatient Medications  Medication Sig Dispense Refill  . Coenzyme Q10 (COQ10) 100 MG CAPS Take 1 capsule by mouth daily.    . Cyanocobalamin (B-12) 1000 MCG CAPS Take 1 capsule by mouth every evening.    . Inositol 500 MG TABS Take 1 tablet by mouth daily.    . Melatonin 3 MG TABS Take 1 tablet by mouth daily.    . Multiple Vitamin (MULTIVITAMIN) tablet Take 0.5 tablets by mouth daily.    Marland Kitchen UNITHROID 150 MCG tablet TAKE 1 TABLET BY MOUTH EVERY DAY BEFORE BREAKFAST 90 tablet 3   No current facility-administered medications for this visit.   Allergies  Allergen Reactions  . Doxycycline Rash   Family history: See history of present illness +: - diabetes: Paternal grandmother - hypertension: Father - Hyperlipidemia: Father - Cancer: Father and grandfather (prostate)  PE: BP (!) 142/98   Pulse (!) 49   Ht 5\' 8"  (1.727 m)   Wt 161 lb (73 kg)   SpO2 99%   BMI 24.48 kg/m  Body mass index is 24.48 kg/m.  Wt Readings from Last 3 Encounters:  10/15/19 161 lb (73 kg)  10/09/18 168 lb (76.2 kg)  10/10/17 166 lb (75.3 kg)   Constitutional: normal weight, in NAD Eyes: PERRLA, EOMI, no exophthalmos ENT: moist mucous membranes, no thyromegaly, no cervical lymphadenopathy Cardiovascular: RRR, No MRG Respiratory: CTA B Gastrointestinal: abdomen soft, NT, ND, BS+ Musculoskeletal: + deformities (Dupuytren contraction in the right fifth finger), strength  intact in all 4 Skin: moist, warm, no rashes Neurological: no tremor with outstretched hands, DTR normal in all 4  ASSESSMENT: 1. Hypothyroidism - 2/2 Hashimoto's thyroidism  2.  Elevated HbA1c  PLAN:  1. Patient with longstanding autoimmune hypothyroidism, on levothyroxine d.a.w. (Unithroid), with good control.  We tried levothyroxine generic and Synthroid in the past but he could not tolerate these well due to fatigue and fluctuating TFTs.  However, he tolerates Unithroid very well.  At last visit, his TSH returned slightly high but this normalized after he decreased, then stopped his carnitine supplement. - latest thyroid labs reviewed with pt >> normal: Lab Results  Component Value Date   TSH 3.64 11/15/2018   - he continues on Unithroid 150 mcg daily -  pt feels good on this dose. - we discussed about taking the thyroid hormone every day, with water, >30 minutes before breakfast, separated by >4 hours from acid reflux medications, calcium, iron, multivitamins. Pt. is taking it correctly.  He does drink coffee + half-and-half immediately after the levothyroxine, but he does this every morning, so we can continue this.  This is probably the reason why he is requiring a slightly higher dose of Unithroid than expected. - will check thyroid tests today: TSH and fT4 - If labs are abnormal, he will need to return for repeat TFTs in 1.5 months  2.  Elevated HbA1c -He had 2 slightly elevated HbA1c (5.8% at 5.7% last year), after which he made several changes: Decreased his melatonin supplement, changed his diet (increased protein and cut down carbs), and also increased exercise.  He is participating in [employer] athletics.  We did discuss about reducing animal protein at last visit, since this is conducive to insulin resistance and cellular aging.  He also reduced and then stopped his carnitine supplement since last visit.  At that time, HbA1c returned improved, at 5.4%. -At this visit,  sugars are mostly at goal in the morning except when he has a higher protein and fat dinner.  We discussed about the benefits of a plant-based diet both for people with diabetes and without.  Also, given references about podcasts discussing diet in relationship with physical training -We will repeat HbA1c today  Needs refills.  Office Visit on 10/15/2019  Component Date Value Ref Range Status  . TSH 10/15/2019 4.64* 0.35 - 4.50 uIU/mL Final  . Free T4 10/15/2019 1.18  0.60 - 1.60 ng/dL Final   Comment: Specimens from patients who are undergoing biotin therapy and /or ingesting biotin supplements may contain high levels of biotin.  The higher biotin concentration in these specimens interferes with this Free T4 assay.  Specimens that contain high levels  of biotin may cause false high results for this Free T4 assay.  Please interpret results in light of the total clinical presentation of the patient.    . Hgb A1c MFr Bld 10/15/2019 5.6  4.6 - 6.5 % Final   Glycemic Control Guidelines for People with Diabetes:Non Diabetic:  <6%Goal of Therapy: <7%Additional Action Suggested:  >8%    HbA1c still normal, slightly high.  TSH slightly high.  Normal free T4.  Msg sent: Dear Cecilie Lowers, Yes, the TSH appears to be slightly higher than goal.  We have several options: 1.  Increase the levothyroxine dose 2.  Move your coffee at least 30 minutes after levothyroxine 3.  Continue the same levothyroxine dose and repeat the tests in 1.5 months. I will let you choose which option you would like to continue with. Sincerely, Philemon Kingdom MD  Pt would like to continue the same dose of Unithroid and recheck his test in 1.5 months.  Philemon Kingdom, MD PhD [facility] Endocrinology

## 2019-10-15 NOTE — Patient Instructions (Addendum)
Please stop at the lab.  Please continue Unithroid 150 mcg daily.  Take the thyroid hormone every day, with water, at least 30 minutes before breakfast, separated by at least 4 hours from: - acid reflux medications - calcium - iron - multivitamins  Check out: - [place] podcast - Loyal Gambler, MD - [employer] podcast - [employer]  Please return in 1 year.

## 2019-10-16 ENCOUNTER — Encounter: Payer: Self-pay | Admitting: Internal Medicine

## 2019-10-16 NOTE — Addendum Note (Signed)
Addended by: Carlus Pavlov on: 10/16/2019 05:11 PM   Modules accepted: Orders

## 2020-03-09 ENCOUNTER — Other Ambulatory Visit: Payer: Self-pay | Admitting: Family Medicine

## 2020-03-09 DIAGNOSIS — R1011 Right upper quadrant pain: Secondary | ICD-10-CM

## 2020-03-18 ENCOUNTER — Ambulatory Visit
Admission: RE | Admit: 2020-03-18 | Discharge: 2020-03-18 | Disposition: A | Discharge status: Home / Self Care | Payer: BC Managed Care – PPO | Source: Ambulatory Visit | Attending: Family Medicine | Admitting: Family Medicine

## 2020-03-18 DIAGNOSIS — R1011 Right upper quadrant pain: Secondary | ICD-10-CM

## 2020-10-05 ENCOUNTER — Other Ambulatory Visit: Payer: Self-pay | Admitting: Internal Medicine

## 2020-10-13 ENCOUNTER — Ambulatory Visit: Payer: BC Managed Care – PPO | Admitting: Internal Medicine

## 2020-10-13 ENCOUNTER — Encounter: Payer: Self-pay | Admitting: Internal Medicine

## 2020-10-13 ENCOUNTER — Other Ambulatory Visit: Payer: Self-pay

## 2020-10-13 VITALS — BP 120/88 | HR 53 | Ht 68.0 in | Wt 160.2 lb

## 2020-10-13 DIAGNOSIS — E038 Other specified hypothyroidism: Secondary | ICD-10-CM

## 2020-10-13 DIAGNOSIS — E063 Autoimmune thyroiditis: Secondary | ICD-10-CM

## 2020-10-13 DIAGNOSIS — R7303 Prediabetes: Secondary | ICD-10-CM | POA: Diagnosis not present

## 2020-10-13 LAB — POCT GLYCOSYLATED HEMOGLOBIN (HGB A1C): Hemoglobin A1C: 5.5 % (ref 4.0–5.6)

## 2020-10-13 LAB — T4, FREE: Free T4: 1.42 ng/dL (ref 0.60–1.60)

## 2020-10-13 LAB — TSH: TSH: 0.47 u[IU]/mL (ref 0.35–4.50)

## 2020-10-13 NOTE — Progress Notes (Signed)
-Patient ID: Nathan Estrada, male   DOB: 1960/07/10, 60 y.o.   MRN: [id number]   This visit occurred during the SARS-CoV-2 public health emergency.  Safety protocols were in place, including screening questions prior to the visit, additional usage of staff PPE, and extensive cleaning of exam room while observing appropriate contact time as indicated for disinfecting solutions.   HPI  Nathan Estrada is a 60 y.o.-year-old male, returning for f/u for Hashimoto's hypothyroidism, dx 1998, and elevated HbA1c. Last visit 1 year ago.  Interim history: He has frequent sinus inflammation. He still trains intensively: 3-4x a week and slightly lighter exercise in the rest of the week. Usually 1.5h a day. He eats a low carb diet.  Elevated HbA1c: In 10/2016, he had a slightly elevated HbA1c of 5.8%, as checked per PCP.  A repeat HbA1c was 5.7%.  Since then, he cut out added sugars, increased animal protein.  He lost weight and was feeling better.   However, he developed fatigue afterwards so he started to complex carbs back.  He cut down red meat to only twice a month.  He felt much better.   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  He was wondering whether he needed T3 supplementation.  I was out of the office with my colleague send him a message that this is not needed in the setting of a normal TSH.  He continues on LT4 only.  He is on Unithroid d.a.w. 150 mcg daily, taken: - in am - fasting - drinks coffee with half-and-half immediately after taking Unithroid - 2h from b'fast -+ Liquid Ca at night - no Fe, PPIs - + MVI later in the day - not on Biotin  She also  tried levothyroxine and Synthroid but had fatigue and fluctuating TFTs. Before last visit, he tried alpha lipoic acid and B1 >> dizziness, fogginess >> stopped.  She was previously on l-carnitine to help with energy.  His TSH returned slightly high.  He stopped the supplement and TSH normalized.  However, last TSH was again slightly high:  Reviewed his TFTs: Lab Results  Component Value Date   TSH 4.64 (H) 10/15/2019   TSH 3.64 11/15/2018   TSH 5.94 (H) 10/09/2018   TSH 0.62 04/22/2018   TSH 2.67 10/10/2017   FREET4 1.18 10/15/2019   FREET4 1.09 11/15/2018   FREET4 1.21 10/09/2018   FREET4 1.23 04/22/2018   FREET4 1.37 10/10/2017  06/03/2013: TSH 5.5, TPO Abs 202   He had elevated TPO antibodies: Component     Latest Ref Rng & Units 01/22/2015  Thyroperoxidase Ab SerPl-aCnc     <9 IU/mL 201 (H)  TPO Abs 1479 at dx  Pt denies: - feeling nodules in neck - hoarseness - dysphagia - choking - SOB with lying down  He had a peritonsillar abscess in 01/2017 >> Prednisone, ABx. Saw ENT afterwards >> lanced.  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  In 2019, he was competing in [employer]. He was  at the [place] >> 5th place at 400 m. 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  A bone scan showed other lesions in pelvis and scalp.    ROS: Constitutional: no weight gain/no weight loss, no fatigue, no subjective hyperthermia, no subjective hypothermia Eyes: no blurry vision, no xerophthalmia ENT: no sore throat, + see HPI Cardiovascular: no CP/no SOB/no palpitations/no leg swelling Respiratory: no cough/no SOB/no wheezing Gastrointestinal: no N/no V/no D/no C/no acid reflux Musculoskeletal: no muscle aches/no joint aches Skin: no rashes, no hair loss Neurological: no tremors/no numbness/no tingling/no dizziness  I  reviewed pt's medications, allergies, PMH, social hx, family hx, and changes were documented in the history of present illness. Otherwise, unchanged from my initial visit note.  Past medical history: - Mild hyperlipidemia - Dysthymia  Past surgical history: - None  History   Social History  . Marital Status: Married    Spouse Name: N/A    Number of Children: 5   Occupational History  . Engineer/counselor/student   Social History Main Topics  . Smoking status: Never Smoker   . Smokeless tobacco: No  . Alcohol Use: Yes, 1-2 drinks once a week: Beer and mixed drinks  . Drug Use: No  For exercise, he is walking, running, doing strength exercises, yoga 3-4 times a week  Current Outpatient Medications  Medication Sig Dispense Refill  . Cyanocobalamin (B-12) 1000 MCG CAPS Take 1 capsule by mouth every evening.    . Melatonin 3 MG TABS Take 1 tablet by mouth daily.    . Multiple Vitamin (MULTIVITAMIN) tablet Take 0.5 tablets by mouth daily.    Marland Kitchen UNITHROID 150 MCG tablet TAKE 1 TABLET BY MOUTH EVERY DAY BEFORE BREAKFAST 30 tablet 0   No current facility-administered medications for this visit.   Allergies  Allergen Reactions  . Doxycycline Rash   Family history: See history of present illness +: - diabetes: Paternal grandmother - hypertension: Father - Hyperlipidemia: Father - Cancer: Father and grandfather (prostate)  PE: BP 120/88 (BP Location: Right Arm, Patient Position: Sitting, Cuff Size: Normal)   Pulse (!) 53   Ht 5\' 8"  (1.727 m)   Wt 160 lb 3.2 oz (72.7 kg)   SpO2 99%   BMI 24.36 kg/m  Body mass index is 24.36 kg/m.  Wt Readings from Last 3 Encounters:  10/13/20 160 lb 3.2 oz (72.7 kg)  10/15/19 161 lb (73 kg)  10/09/18 168 lb (76.2 kg)   Constitutional: normal weight, in NAD Eyes: PERRLA, EOMI, no exophthalmos ENT: moist mucous membranes, no thyromegaly, no cervical lymphadenopathy Cardiovascular: RRR, No MRG Respiratory: CTA B Gastrointestinal:  abdomen soft, NT, ND, BS+ Musculoskeletal: + deformities (Dupuytren contraction in the right fifth finger), strength intact in all 4 Skin: moist, warm, no rashes Neurological: no tremor with outstretched hands, DTR normal in all 4  ASSESSMENT: 1. Hypothyroidism - 2/2 Hashimoto's thyroidism  2.  Prediabetes  PLAN:  1. Patient with longstanding autoimmune hypothyroidism, on levothyroxine d.a.w. (Unithroid), with previously good control but slightly higher TSH at last visit. -Of note, we tried levothyroxine generic and Synthroid d.a.w. in the past but he could not tolerate this well due to fatigue and fluctuating TFTs. - latest thyroid labs reviewed with pt >> slightly elevated: Lab Results  Component Value Date   TSH 4.64 (H) 10/15/2019   - he continues on Unithroid 150 mcg daily.  We discussed about repeating the TFTs and then changed to Unithroid dose as needed, but he did  not return for repeat check. - pt feels good on this dose. - we discussed about taking the thyroid hormone every day, with water, >30 minutes before breakfast, separated by >4 hours from acid reflux medications, calcium, iron, multivitamins. Pt. does drink coffee and half-and-half immediately after levothyroxine but he does this every morning, so we continued with this in the past.  However, now that he is TSH fluctuates below and above the upper limit of normal, we discussed about possibly skipping the half-and-half or moving the coffee at least 30 minutes later.  We discussed that if he does change this, we will need to repeat this test in 1.5 months. - will check thyroid tests today: TSH and fT4 - If labs are abnormal, he will need to return for repeat TFTs in 1.5 months -Otherwise, we will see him back in a year  2.  Prediabetes -He had 2 slightly elevated HbA1c levels, at 5.8 and 5.7% in 2018, after which he made several changes: Decrease his melatonin supplement, changed his diet (increased protein and cut down  carbs), and also increased exercise.  We did discuss about reducing animal proteins in his diet since this is conducive to insulin resistance and cellular aging.  He reduced and stopped his carnitine supplement before last visit.  HbA1c improved to 5.4% and a repeat was 5.6% at last visit.  At that time, sugars are mostly at goal with the exception of morning sugars after higher protein and fat dinners.  We discussed about the benefit of a plant-based diet. Latest HbA1c was again 5.8% per review of labs obtained by PCP in 04/2020. -At today's visit, HbA1c is 5.5% (lower) -He is not requiring any medications for now  Needs refills.  Component     Latest Ref Rng & Units 10/13/2020  TSH     0.35 - 4.50 uIU/mL 0.47  T4,Free(Direct)     0.60 - 1.60 ng/dL 9.23  Hemoglobin R0Q     4.0 - 5.6 % 5.5  TFTs normal.  Nathan Pavlov, MD PhD [facility] Endocrinology

## 2020-10-13 NOTE — Patient Instructions (Addendum)
Please stop at the lab.  Please continue Unithroid 150 mcg daily.  Take the thyroid hormone every day, with water, at least 30 minutes before breakfast, separated by at least 4 hours from: - acid reflux medications - calcium - iron - multivitamins  Try to move the coffee >30 min after thyroid med.  Please return in 1 year.

## 2020-11-02 ENCOUNTER — Other Ambulatory Visit: Payer: Self-pay | Admitting: Internal Medicine

## 2020-11-25 ENCOUNTER — Other Ambulatory Visit: Payer: Self-pay | Admitting: Internal Medicine

## 2020-11-25 ENCOUNTER — Other Ambulatory Visit: Payer: Self-pay

## 2020-11-25 ENCOUNTER — Other Ambulatory Visit (INDEPENDENT_AMBULATORY_CARE_PROVIDER_SITE_OTHER): Payer: BC Managed Care – PPO

## 2020-11-25 DIAGNOSIS — E038 Other specified hypothyroidism: Secondary | ICD-10-CM

## 2020-11-25 DIAGNOSIS — E063 Autoimmune thyroiditis: Secondary | ICD-10-CM

## 2020-11-25 LAB — T4, FREE: Free T4: 1.59 ng/dL (ref 0.60–1.60)

## 2020-11-25 LAB — TSH: TSH: 0.14 u[IU]/mL — ABNORMAL LOW (ref 0.35–5.50)

## 2020-11-25 MED ORDER — UNITHROID 137 MCG PO TABS: 137.0000 ug | ORAL_TABLET | Freq: Every day | ORAL | 3 refills | Status: DC

## 2020-11-26 ENCOUNTER — Encounter: Payer: Self-pay | Admitting: Internal Medicine

## 2020-11-30 ENCOUNTER — Other Ambulatory Visit: Payer: Self-pay | Admitting: Internal Medicine

## 2020-12-07 ENCOUNTER — Encounter: Payer: Self-pay | Admitting: Internal Medicine

## 2021-01-03 ENCOUNTER — Encounter: Payer: Self-pay | Admitting: Internal Medicine

## 2021-01-04 ENCOUNTER — Other Ambulatory Visit: Payer: Self-pay | Admitting: Internal Medicine

## 2021-01-04 ENCOUNTER — Other Ambulatory Visit: Payer: Self-pay

## 2021-01-04 ENCOUNTER — Other Ambulatory Visit (INDEPENDENT_AMBULATORY_CARE_PROVIDER_SITE_OTHER): Payer: BC Managed Care – PPO

## 2021-01-04 DIAGNOSIS — E063 Autoimmune thyroiditis: Secondary | ICD-10-CM

## 2021-01-04 DIAGNOSIS — E038 Other specified hypothyroidism: Secondary | ICD-10-CM

## 2021-01-04 LAB — TSH: TSH: 1.73 u[IU]/mL (ref 0.35–5.50)

## 2021-01-04 LAB — T4, FREE: Free T4: 1.25 ng/dL (ref 0.60–1.60)

## 2021-01-05 ENCOUNTER — Other Ambulatory Visit: Payer: BC Managed Care – PPO

## 2021-01-06 ENCOUNTER — Other Ambulatory Visit: Payer: Self-pay | Admitting: Internal Medicine

## 2021-02-22 ENCOUNTER — Emergency Department (HOSPITAL_BASED_OUTPATIENT_CLINIC_OR_DEPARTMENT_OTHER)
Admission: EM | Admit: 2021-02-22 | Discharge: 2021-02-22 | Disposition: A | Discharge status: Home / Self Care | Payer: BC Managed Care – PPO | Attending: Emergency Medicine | Admitting: Emergency Medicine

## 2021-02-22 ENCOUNTER — Encounter (HOSPITAL_BASED_OUTPATIENT_CLINIC_OR_DEPARTMENT_OTHER): Payer: Self-pay | Admitting: Emergency Medicine

## 2021-02-22 ENCOUNTER — Other Ambulatory Visit: Payer: Self-pay

## 2021-02-22 DIAGNOSIS — Z20822 Contact with and (suspected) exposure to covid-19: Secondary | ICD-10-CM | POA: Diagnosis not present

## 2021-02-22 DIAGNOSIS — J019 Acute sinusitis, unspecified: Secondary | ICD-10-CM | POA: Insufficient documentation

## 2021-02-22 DIAGNOSIS — M791 Myalgia, unspecified site: Secondary | ICD-10-CM | POA: Insufficient documentation

## 2021-02-22 DIAGNOSIS — R0981 Nasal congestion: Secondary | ICD-10-CM | POA: Diagnosis present

## 2021-02-22 LAB — RESP PANEL BY RT-PCR (FLU A&B, COVID) ARPGX2
Influenza A by PCR: NEGATIVE
Influenza B by PCR: NEGATIVE
SARS Coronavirus 2 by RT PCR: NEGATIVE

## 2021-02-22 MED ORDER — ONDANSETRON 4 MG PO TBDP
4.0000 mg | ORAL_TABLET | Freq: Once | ORAL | Status: AC
  Administered 2021-02-22: 4 mg via ORAL
  Filled 2021-02-22: qty 1

## 2021-02-22 MED ORDER — AMOXICILLIN-POT CLAVULANATE 875-125 MG PO TABS
1.0000 | ORAL_TABLET | Freq: Once | ORAL | Status: AC
  Administered 2021-02-22: 1 via ORAL
  Filled 2021-02-22: qty 1

## 2021-02-22 MED ORDER — PSEUDOEPHEDRINE HCL ER 120 MG PO TB12: 120.0000 mg | ORAL_TABLET | Freq: Two times a day (BID) | ORAL | 0 refills | Status: DC

## 2021-02-22 MED ORDER — AMOXICILLIN-POT CLAVULANATE 875-125 MG PO TABS: 1.0000 | ORAL_TABLET | Freq: Two times a day (BID) | ORAL | 0 refills | Status: AC

## 2021-02-22 MED ORDER — FLUTICASONE PROPIONATE 50 MCG/ACT NA SUSP: 2.0000 | Freq: Every day | NASAL | 0 refills | Status: DC

## 2021-02-22 NOTE — ED Provider Notes (Signed)
[facility] EMERGENCY [facility] Emergency Department Provider Note MRN:  [id number]  Arrival date & time: 02/22/21     Chief Complaint   Nasal Congestion and Generalized Body Aches   History of Present Illness   Nathan Estrada is a 60 y.o. year-old male with no pertinent past medical history presenting to the ED with chief complaint of nasal congestion.  Location: Nose, face Duration: 2 or 3 days Onset: Gradual Timing: Constant Description: Lots of pressure Severity: Moderate Exacerbating/Alleviating Factors: None Associated Symptoms: Facial pressure, nasal congestion, body aches, malaise Pertinent Negatives: No headache or vision change, no numbness or weakness to the arms or legs, no chest pain or shortness of breath  Additional History: Recently recovered from a seemingly viral illness and now having a lot of nasal pressure.  Review of Systems  A complete 10 system review of systems was obtained and all systems are negative except as noted in the HPI and PMH.   Patient's Health History    Past Medical History:  Diagnosis Date   Thyroid disease     History reviewed. No pertinent surgical history.  History reviewed. No pertinent family history.  Social History   Socioeconomic History   Marital status: Married    Spouse name: Not on file   Number of children: Not on file   Years of education: Not on file   Highest education level: Not on file  Occupational History   Not on file  Tobacco Use   Smoking status: Never   Smokeless tobacco: Never  Vaping Use   Vaping Use: Never used  Substance and Sexual Activity   Alcohol use: Yes    Alcohol/week: 0.0 standard drinks   Drug use: No   Sexual activity: Not on file  Other Topics Concern   Not on file  Social History Narrative   Not on file   Social Determinants of Health   Financial Resource Strain: Not on file  Food Insecurity: Not on file  Transportation Needs: Not on file  Physical Activity: Not on  file  Stress: Not on file  Social Connections: Not on file  Intimate Partner Violence: Not on file     Physical Exam   Vitals:   02/22/21 0039 02/22/21 0405  BP: (!) 152/81 118/67  Pulse: 62 (!) 56  Resp: 18 18  Temp: 97.7 F (36.5 C)   SpO2: 99% 98%    CONSTITUTIONAL: Well-appearing, NAD NEURO:  Alert and oriented x 3, no focal deficits EYES:  eyes equal and reactive ENT/NECK:  no LAD, no JVD CARDIO: Regular rate, well-perfused, normal S1 and S2 PULM:  CTAB no wheezing or rhonchi GI/GU:  normal bowel sounds, non-distended, non-tender MSK/SPINE:  No gross deformities, no edema SKIN:  no rash, atraumatic PSYCH:  Appropriate speech and behavior  *Additional and/or pertinent findings included in MDM below  Diagnostic and Interventional Summary    EKG Interpretation  Date/Time:    Ventricular Rate:    PR Interval:    QRS Duration:   QT Interval:    QTC Calculation:   R Axis:     Text Interpretation:         Labs Reviewed  RESP PANEL BY RT-PCR (FLU A&B, COVID) [id number]    No orders to display    Medications  amoxicillin-clavulanate (AUGMENTIN) 875-125 MG per tablet 1 tablet (has no administration in time range)  ondansetron (ZOFRAN-ODT) disintegrating tablet 4 mg (4 mg Oral Given 02/22/21 0159)     Procedures  /  Critical  Care Procedures  ED Course and Medical Decision Making  I have reviewed the triage vital signs, the nursing notes, and pertinent available records from the EMR.  Listed above are laboratory and imaging tests that I personally ordered, reviewed, and interpreted and then considered in my medical decision making (see below for details).  Suspect secondary bacterial sinusitis.  Otherwise a reassuring exam, normal vital signs, appropriate for discharge on antibiotics.       Elmer Sow. Pilar Plate, MD [facility] Emergency Medicine [facility] [email or web address]  Final Clinical Impressions(s) / ED Diagnoses     ICD-10-CM   1.  Acute sinusitis, recurrence not specified, unspecified location  J01.90       ED Discharge Orders          Ordered    amoxicillin-clavulanate (AUGMENTIN) 875-125 MG tablet  Every 12 hours        02/22/21 0457    fluticasone (FLONASE) 50 MCG/ACT nasal spray  Daily        02/22/21 0457    pseudoephedrine (SUDAFED 12 HOUR) 120 MG 12 hr tablet  2 times daily        02/22/21 0457             Discharge Instructions Discussed with and Provided to Patient:    Discharge Instructions      You were evaluated in the Emergency Department and after careful evaluation, we did not find any emergent condition requiring admission or further testing in the hospital.  Your exam/testing today was overall reassuring.  Symptoms may be due to a bacterial sinus infection.  Please take the Augmentin antibiotic as directed.  Can use Flonase and/or Sudafed for symptoms.  Please return to the Emergency Department if you experience any worsening of your condition.  Thank you for allowing [place] to be a part of your care.        Sabas Sous, MD 02/22/21 0500

## 2021-02-22 NOTE — ED Notes (Signed)
Pt had episode of emesis while in triage

## 2021-02-22 NOTE — Discharge Instructions (Addendum)
You were evaluated in the Emergency Department and after careful evaluation, we did not find any emergent condition requiring admission or further testing in the hospital.  Your exam/testing today was overall reassuring.  Symptoms may be due to a bacterial sinus infection.  Please take the Augmentin antibiotic as directed.  Can use Flonase and/or Sudafed for symptoms.  Please return to the Emergency Department if you experience any worsening of your condition.  Thank you for allowing [place] to be a part of your care.

## 2021-02-22 NOTE — ED Triage Notes (Signed)
  Patient states he has nasal congestion and generalized body aches that have been going on for two weeks.  Patient states he had a cold that started on 9/18 and felt like it went away.  Patient states that he feels worse and is still congested.  Pain 8/10, body aches and sinus pressure.  No OTC medications in the last 24 hours.  No fevers at home.

## 2021-03-30 ENCOUNTER — Encounter: Payer: Self-pay | Admitting: Internal Medicine

## 2021-03-30 ENCOUNTER — Other Ambulatory Visit (INDEPENDENT_AMBULATORY_CARE_PROVIDER_SITE_OTHER): Payer: BC Managed Care – PPO

## 2021-03-30 ENCOUNTER — Other Ambulatory Visit: Payer: Self-pay

## 2021-03-30 DIAGNOSIS — E038 Other specified hypothyroidism: Secondary | ICD-10-CM

## 2021-03-30 DIAGNOSIS — E063 Autoimmune thyroiditis: Secondary | ICD-10-CM

## 2021-03-30 LAB — T4, FREE: Free T4: 1.06 ng/dL (ref 0.60–1.60)

## 2021-03-30 LAB — TSH: TSH: 4.8 u[IU]/mL (ref 0.35–5.50)

## 2021-03-31 ENCOUNTER — Other Ambulatory Visit: Payer: Self-pay | Admitting: Internal Medicine

## 2021-03-31 DIAGNOSIS — E038 Other specified hypothyroidism: Secondary | ICD-10-CM

## 2021-05-19 ENCOUNTER — Other Ambulatory Visit: Payer: Self-pay | Admitting: Internal Medicine

## 2021-05-26 ENCOUNTER — Encounter: Payer: Self-pay | Admitting: Internal Medicine

## 2021-06-01 ENCOUNTER — Other Ambulatory Visit (INDEPENDENT_AMBULATORY_CARE_PROVIDER_SITE_OTHER): Payer: BC Managed Care – PPO

## 2021-06-01 ENCOUNTER — Other Ambulatory Visit: Payer: Self-pay

## 2021-06-01 DIAGNOSIS — E063 Autoimmune thyroiditis: Secondary | ICD-10-CM | POA: Diagnosis not present

## 2021-06-01 DIAGNOSIS — E038 Other specified hypothyroidism: Secondary | ICD-10-CM | POA: Diagnosis not present

## 2021-06-01 LAB — T4, FREE: Free T4: 1.41 ng/dL (ref 0.60–1.60)

## 2021-06-01 LAB — TSH: TSH: 2.11 u[IU]/mL (ref 0.35–5.50)

## 2021-06-01 LAB — T3, FREE: T3, Free: 3.6 pg/mL (ref 2.3–4.2)

## 2021-08-17 ENCOUNTER — Other Ambulatory Visit: Payer: Self-pay | Admitting: Internal Medicine

## 2021-10-19 ENCOUNTER — Encounter: Payer: Self-pay | Admitting: Internal Medicine

## 2021-10-19 ENCOUNTER — Other Ambulatory Visit: Payer: Self-pay

## 2021-10-19 ENCOUNTER — Ambulatory Visit: Payer: BC Managed Care – PPO | Admitting: Internal Medicine

## 2021-10-19 VITALS — BP 126/70 | HR 73 | Ht 68.0 in | Wt 161.2 lb

## 2021-10-19 DIAGNOSIS — E063 Autoimmune thyroiditis: Secondary | ICD-10-CM

## 2021-10-19 DIAGNOSIS — E038 Other specified hypothyroidism: Secondary | ICD-10-CM

## 2021-10-19 DIAGNOSIS — R7303 Prediabetes: Secondary | ICD-10-CM

## 2021-10-19 LAB — TSH: TSH: 0.48 u[IU]/mL (ref 0.35–5.50)

## 2021-10-19 LAB — T4, FREE: Free T4: 1.53 ng/dL (ref 0.60–1.60)

## 2021-10-19 LAB — HEMOGLOBIN A1C: Hgb A1c MFr Bld: 5.6 % (ref 4.6–6.5)

## 2021-10-19 MED ORDER — UNITHROID 137 MCG PO TABS: ORAL_TABLET | ORAL | 3 refills | Status: DC

## 2021-10-19 NOTE — Progress Notes (Signed)
-Patient ID: Nathan Estrada, male   DOB: 09/01/1960, 61 y.o.   MRN: [id number]   This visit occurred during the SARS-CoV-2 public health emergency.  Safety protocols were in place, including screening questions prior to the visit, additional usage of staff PPE, and extensive cleaning of exam room while observing appropriate contact time as indicated for disinfecting solutions.   HPI  Nathan Estrada is a 61 y.o.-year-old male, returning for f/u for Hashimoto's hypothyroidism, dx 1998, and elevated HbA1c. Last visit 1 year ago.  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 Usually 1h a day.  He changed from low carb to a low fat diet.  Also, he eliminated processed foods.  He even takes his own bread.  He feels better. Lost 4 lbs. He had dysphagia at one point >> resolved.   Elevated HbA1c: In 10/2016, he had a slightly elevated HbA1c of 5.8%, as checked per PCP.  A repeat HbA1c was 5.7%.  Since then, he cut out added sugars, increased animal protein.  He lost weight and was feeling better.     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  He was wondering whether he needed T3 supplementation.  I was out of the office with my colleague send him a message that this is not needed in the setting of a normal TSH.  He continues on LT4 only.  He is on Unithroid d.a.w. 137 mcg daily, taken: - in am - fasting - was drinking coffee with half-and-half immediately after taking Unithroid >> now moved it 1h later - 2h from b'fast -+ Liquid Ca at night - + Fe 2-3x a week - no PPIs - + MVI later in  the day - not on Biotin  He also tried levothyroxine and Synthroid but had fatigue and fluctuating TFTs. He previously tried alpha lipoic acid and B1 >> dizziness, fogginess >> stopped. He was previously on l-carnitine to help with energy.  His TSH returned slightly high, so he stopped this.  Reviewed his TFTs: Lab Results  Component Value Date   TSH 2.11 06/01/2021   TSH 4.80 03/30/2021   TSH 1.73 01/04/2021   TSH 0.14 (L) 11/25/2020   TSH 0.47 10/13/2020   FREET4 1.41 06/01/2021   FREET4 1.06 03/30/2021   FREET4 1.25 01/04/2021   FREET4 1.59 11/25/2020   FREET4 1.42 10/13/2020  06/03/2013: TSH 5.5, TPO Abs 202   He had elevated TPO antibodies: Component     Latest Ref Rng & Units 01/22/2015  Thyroperoxidase Ab SerPl-aCnc     <9 IU/mL 201 (H)  TPO Abs 1479 at dx  Pt denies: - feeling nodules in neck - hoarseness - dysphagia - choking - SOB with lying down  He had a peritonsillar abscess in 01/2017 >> Prednisone, ABx. Saw ENT afterwards >> lanced. He also has a diagnosis of fibrous dysplasia.  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 In 2019, he was competing  in [employer]. [facility] >> 5th place at 400 m. 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  A bone scan showed other lesions in pelvis and scalp.    ROS: + see HPI  I reviewed pt's medications, allergies, PMH, social hx, family hx, and changes were documented in the history of present illness. Otherwise, unchanged from my initial visit note.  Past medical history: - Mild hyperlipidemia - Dysthymia  Past surgical history: - None  History   Social History   Marital Status: Married    Spouse Name: N/A    Number of Children: 5   Occupational History   Engineer/counselor/student   Social History Main Topics   Smoking status: Never  Smoker    Smokeless tobacco: No   Alcohol Use: Yes, 1-2 drinks once a week: Beer and mixed drinks   Drug Use: No  For exercise, he is walking, running, doing strength exercises, yoga 3-4 times a week  Current Outpatient Medications  Medication Sig Dispense Refill   Cyanocobalamin (B-12) 1000 MCG CAPS Take 1 capsule by mouth every evening.     fluticasone (FLONASE) 50 MCG/ACT nasal spray Place 2 sprays into both nostrils daily for 7 days. 9.9 mL 0   Melatonin 3 MG TABS Take 1 tablet by mouth daily.     Multiple Vitamin (MULTIVITAMIN) tablet Take 0.5 tablets by mouth daily.     pseudoephedrine (SUDAFED 12 HOUR) 120 MG 12 hr tablet Take 1 tablet (120 mg total) by mouth 2 (two) times daily. 20 tablet 0   UNITHROID 137 MCG tablet TAKE 1 TABLET BY MOUTH EVERY DAY BEFORE BREAKFAST 90 tablet 0   No current facility-administered medications for this visit.   Allergies  Allergen Reactions   Doxycycline Rash   Family history: See history of present illness +: - diabetes: Paternal grandmother - hypertension: Father - Hyperlipidemia: Father - Cancer: Father and grandfather (prostate)  PE: BP 126/70 (BP Location: Left Arm, Patient Position: Sitting, Cuff Size: Small)   Pulse 73   Ht 5\' 8"  (1.727 m)   Wt 161 lb 3.2 oz (73.1 kg)   SpO2 99%   BMI 24.51 kg/m    Wt Readings from Last 3 Encounters:  10/19/21 161 lb 3.2 oz (73.1 kg)  02/22/21 154 lb (69.9 kg)  10/13/20 160 lb 3.2 oz (72.7 kg)   Constitutional: normal weight, in NAD Eyes: PERRLA, EOMI, no exophthalmos ENT: moist mucous membranes, no thyromegaly, no cervical lymphadenopathy Cardiovascular: RRR, No MRG Respiratory: CTA B Musculoskeletal: + deformities (Dupuytren contraction in the right fifth finger), strength intact in all 4 Skin: moist, warm, no rashes Neurological: no tremor with outstretched hands, DTR normal in all 4  ASSESSMENT: 1. Hypothyroidism - 2/2 Hashimoto's thyroidism  2.  Prediabetes  PLAN:  1. Patient  with longstanding autoimmune hypothyroidism - he is on Unithroid d.a.w.  We tried generic levothyroxine and Synthroid d.a.w. in the past but he could not tolerate these well due to fatigue and fluctuating TFTs - latest thyroid labs reviewed with pt. >> normal: Lab Results  Component Value Date   TSH 2.11 06/01/2021  - he continues on LT4 (Unithroid d.a.w.) 137 mcg daily - pt feels good on this dose, without fatigue, heat intolerance, tremors, unintentional weight changes. - we discussed about taking the thyroid hormone every day, with water, >30 minutes before breakfast, separated by >4 hours from acid reflux medications, calcium, iron, multivitamins. Pt. is taking it correctly. Pt. was  drinking coffee and half-and-half immediately after levothyroxine but he was doing this every morning, so we continued with this in the past.  However, due to persistently fluctuating TFTs, I advised him to skip the half-and-half or moving the coffee later.  At this visit, he tells me that he drinks his coffee black and he waits an hour after levothyroxine.  This is difficult for him to do.  I advised him that if he drinks it block, he can take the levothyroxine with a cough. - will check thyroid tests today: TSH and fT4 - If labs are abnormal, he will need to return for repeat TFTs in 1.5 months -Otherwise, we will see him back in 1 year  2.  Prediabetes -After his diagnosis of prediabetes in 2019, he made several changes: Decreased his melatonin supplement, changed his diet (increased protein and cut down carbs), and also increased exercise.  We did discuss about reducing animal proteins in his diet since this was conducive to insulin resistance and cellular aging.  He reduced and stopped his carnitine supplement.  HbA1c improved to 5.4% and a repeat was 5.6% at last visit.  At that time, sugars were mostly at goal with the exception of morning sugars after higher protein and fat dinners.  We discussed about the  benefit of a plant-based diet.  At this visit, he tells me that he switched from a low carb to a low-fat diet.  He feels better and lost 4 pounds.  His sugars in the morning remain controlled. -At last visit, HbA1c was lower, at 5.5%.  At this visit, we will recheck his HbA1c. -He does not require any medications for now  Needs refills.  Office Visit on 10/19/2021  Component Date Value Ref Range Status   TSH 10/19/2021 0.48  0.35 - 5.50 uIU/mL Final   Free T4 10/19/2021 1.53  0.60 - 1.60 ng/dL Final   Comment: Specimens from patients who are undergoing biotin therapy and /or ingesting biotin supplements may contain high levels of biotin.  The higher biotin concentration in these specimens interferes with this Free T4 assay.  Specimens that contain high levels  of biotin may cause false high results for this Free T4 assay.  Please interpret results in light of the total clinical presentation of the patient.     Hgb A1c MFr Bld 10/19/2021 5.6  4.6 - 6.5 % Final   Glycemic Control Guidelines for People with Diabetes:Non Diabetic:  <6%Goal of Therapy: <7%Additional Action Suggested:  >8%    Normal TFTs.  Carlus Pavlov, MD PhD [facility] Endocrinology

## 2021-10-19 NOTE — Patient Instructions (Addendum)
Please stop at the lab.  Please continue Unithroid 137 mcg daily.  Take the thyroid hormone every day, with water, at least 30 minutes before breakfast, separated by at least 4 hours from: - acid reflux medications - calcium - iron - multivitamins  You can take Unithroid with your black coffee in am.  Please return in 1 year.

## 2022-02-28 ENCOUNTER — Encounter: Payer: Self-pay | Admitting: Internal Medicine

## 2022-02-28 ENCOUNTER — Other Ambulatory Visit: Payer: Self-pay | Admitting: Internal Medicine

## 2022-02-28 DIAGNOSIS — E063 Autoimmune thyroiditis: Secondary | ICD-10-CM

## 2022-03-08 ENCOUNTER — Other Ambulatory Visit (INDEPENDENT_AMBULATORY_CARE_PROVIDER_SITE_OTHER): Payer: BC Managed Care – PPO

## 2022-03-08 DIAGNOSIS — E038 Other specified hypothyroidism: Secondary | ICD-10-CM | POA: Diagnosis not present

## 2022-03-08 DIAGNOSIS — E063 Autoimmune thyroiditis: Secondary | ICD-10-CM | POA: Diagnosis not present

## 2022-03-08 LAB — T4, FREE: Free T4: 1.63 ng/dL — ABNORMAL HIGH (ref 0.60–1.60)

## 2022-03-08 LAB — TSH: TSH: 0.64 u[IU]/mL (ref 0.35–5.50)

## 2022-08-23 ENCOUNTER — Encounter: Payer: Self-pay | Admitting: Internal Medicine

## 2022-09-12 ENCOUNTER — Other Ambulatory Visit: Payer: Self-pay | Admitting: Internal Medicine

## 2022-09-12 ENCOUNTER — Encounter: Payer: Self-pay | Admitting: Internal Medicine

## 2022-09-12 DIAGNOSIS — E038 Other specified hypothyroidism: Secondary | ICD-10-CM

## 2022-09-12 DIAGNOSIS — R7303 Prediabetes: Secondary | ICD-10-CM

## 2022-09-12 DIAGNOSIS — E063 Autoimmune thyroiditis: Secondary | ICD-10-CM

## 2022-09-20 ENCOUNTER — Other Ambulatory Visit (INDEPENDENT_AMBULATORY_CARE_PROVIDER_SITE_OTHER): Payer: BC Managed Care – PPO

## 2022-09-20 DIAGNOSIS — E063 Autoimmune thyroiditis: Secondary | ICD-10-CM | POA: Diagnosis not present

## 2022-09-20 DIAGNOSIS — R7303 Prediabetes: Secondary | ICD-10-CM | POA: Diagnosis not present

## 2022-09-20 DIAGNOSIS — E038 Other specified hypothyroidism: Secondary | ICD-10-CM | POA: Diagnosis not present

## 2022-09-20 LAB — T4, FREE: Free T4: 1.38 ng/dL (ref 0.60–1.60)

## 2022-09-20 LAB — TSH: TSH: 0.93 u[IU]/mL (ref 0.35–5.50)

## 2022-09-20 LAB — HEMOGLOBIN A1C: Hgb A1c MFr Bld: 5.7 % (ref 4.6–6.5)

## 2022-10-04 ENCOUNTER — Ambulatory Visit: Payer: BC Managed Care – PPO | Admitting: Internal Medicine

## 2022-10-04 ENCOUNTER — Encounter: Payer: Self-pay | Admitting: Internal Medicine

## 2022-10-04 VITALS — BP 110/68 | HR 44 | Ht 68.0 in | Wt 156.8 lb

## 2022-10-04 DIAGNOSIS — E063 Autoimmune thyroiditis: Secondary | ICD-10-CM

## 2022-10-04 DIAGNOSIS — R7303 Prediabetes: Secondary | ICD-10-CM | POA: Diagnosis not present

## 2022-10-04 DIAGNOSIS — E038 Other specified hypothyroidism: Secondary | ICD-10-CM | POA: Diagnosis not present

## 2022-10-04 MED ORDER — UNITHROID 137 MCG PO TABS: ORAL_TABLET | ORAL | 3 refills | Status: DC

## 2022-10-04 NOTE — Patient Instructions (Signed)
Please continue Unithroid 137 mcg daily.  Take the thyroid hormone every day, with water, at least 30 minutes before breakfast, separated by at least 4 hours from: - acid reflux medications - calcium - iron - multivitamins  Please return in 1 year.

## 2022-10-04 NOTE — Progress Notes (Signed)
Patient ID: Nathan Estrada, male   DOB: Jun 05, 1960, 62 y.o.   MRN: [id number]   HPI  Nathan Estrada is a 62 y.o.-year-old male, returning for f/u for Hashimoto's hypothyroidism, dx 1998, and prediabetes. Last visit 1 year ago.  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 Ave 35 mi a week running, strength training 2x a week. He had gingival surgery last week >> now liquid diet.  Elevated HbA1c: In 10/2016, he had a slightly elevated HbA1c of 5.8%, as checked per PCP.  A repeat HbA1c was 5.7%.  Since then, he cut out added sugars, increased animal protein, and then switch to a low fat diet.   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  He was wondering whether he needed T3 supplementation.  I was out of the office with my colleague send him a message that this is not needed in the setting of a normal TSH.  He continues on LT4 only.  He is on Unithroid d.a.w. 137 mcg daily, taken: - in am - fasting - was drinking coffee with half-and-half immediately after taking Unithroid >> now black, decaf - 2h from b'fast -+ Liquid Ca at night - + Fe 2-3x a week - no PPIs - + MVI later in the day - not on Biotin  He also tried levothyroxine and Synthroid but had fatigue and fluctuating TFTs. He previously tried alpha lipoic acid and B1 >> dizziness, fogginess >> stopped. He was previously on l-carnitine to help with energy.  His TSH returned slightly high, so he stopped this. On Creatine. On Melatonin 2.5 mg at night.  Reviewed  his TFTs: Lab Results  Component Value Date   TSH 0.93 09/20/2022   TSH 0.64 03/08/2022   TSH 0.48 10/19/2021   TSH 2.11 06/01/2021   TSH 4.80 03/30/2021   FREET4 1.38 09/20/2022   FREET4 1.63 (H) 03/08/2022   FREET4 1.53 10/19/2021   FREET4 1.41 06/01/2021   FREET4 1.06 03/30/2021  06/03/2013: TSH 5.5, TPO Abs 202   He had elevated TPO antibodies: Component     Latest Ref Rng & Units 01/22/2015  Thyroperoxidase Ab SerPl-aCnc     <9 IU/mL 201 (H)  TPO Abs 1479 at dx  Pt denies: - feeling nodules in neck - hoarseness - dysphagia - choking  He had a peritonsillar abscess in 01/2017 >> Prednisone, ABx. Saw ENT afterwards >> lanced. 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  A bone scan showed other lesions in pelvis and scalp.    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 He continues to Technical sales engineer.  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   ROS: + see HPI  I reviewed pt's medications, allergies, PMH, social hx, family hx, and changes were documented in the history of present illness. Otherwise, unchanged from  my initial visit note.  Past medical history: - Mild hyperlipidemia - Dysthymia  Past surgical history: - None  History   Social History   Marital Status: Married    Spouse Name: N/A    Number of Children: 5   Occupational History   Engineer/counselor/student   Social History Main Topics   Smoking status: Never Smoker    Smokeless tobacco: No   Alcohol Use: Yes, 1-2 drinks once a week: Beer and mixed drinks   Drug Use: No  For exercise, he is walking, running, doing strength exercises, yoga 3-4 times a week  Current Outpatient Medications  Medication Sig Dispense Refill   Cyanocobalamin (B-12) 1000 MCG CAPS Take 1 capsule by mouth every evening.     Melatonin 3 MG TABS Take 1 tablet  by mouth daily.     Multiple Vitamin (MULTIVITAMIN) tablet Take 0.5 tablets by mouth daily.     UNITHROID 137 MCG tablet TAKE 1 TABLET BY MOUTH EVERY DAY BEFORE BREAKFAST 90 tablet 3   No current facility-administered medications for this visit.   Allergies  Allergen Reactions   Doxycycline Rash   Family history: See history of present illness +: - diabetes: Paternal grandmother - hypertension: Father - Hyperlipidemia: Father - Cancer: Father and grandfather (prostate)  PE: BP 110/68 (BP Location: Right Arm, Patient Position: Sitting, Cuff Size: Normal)   Pulse (!) 44   Ht 5\' 8"  (1.727 m)   Wt 156 lb 12.8 oz (71.1 kg)   SpO2 98%   BMI 23.84 kg/m    Wt Readings from Last 3 Encounters:  10/04/22 156 lb 12.8 oz (71.1 kg)  10/19/21 161 lb 3.2 oz (73.1 kg)  02/22/21 154 lb (69.9 kg)   Constitutional: normal weight, in NAD Eyes: EOMI, no exophthalmos ENT: no thyromegaly, no cervical lymphadenopathy Cardiovascular: Bradycardia, RR, No MRG Respiratory: CTA B Musculoskeletal: + deformities (Dupuytren contraction in the right fifth finger) Skin: o rashes Neurological: no tremor with outstretched hands  ASSESSMENT: 1. Hypothyroidism - 2/2 Hashimoto's thyroidism  2.  Prediabetes  PLAN:  1. Patient with longstanding autoimmune hypothyroidism - he is on Unithroid d.a.w.  We tried generic levothyroxine and Synthroid d.a.w. in the past but he could not tolerate these well due to fatigue and fluctuating TFTs - latest thyroid labs reviewed with pt. (he prefers to get these checked before the visit)>> normal 2 weeks ago: Lab Results  Component Value Date   TSH 0.93 09/20/2022  - he continues on LT4 (Unithroid d.a.w.) 137 mcg daily refilled today>> - pt feels good on this dose.  He realized that he has to not exercise the day prior to labs for better results. - we discussed about taking the thyroid hormone every day, with water, >30 minutes before breakfast, separated by >4 hours  from acid reflux medications, calcium, iron, multivitamins. Pt. is taking it correctly. -I will see him back in a year  2.  Prediabetes -He was diagnosed with prediabetes in 2019, after he made several changes: Decreased his melatonin supplement, changed his diet (high-protein low-carb) and increased exercise.  At previous visits, I did recommend reducing animal proteins since this was conducive to insulin resistance and cellular aging.  She is switched to a low fat diet.  He reduced and stopped his carnitine supplement.  HbA1c improved to 5.4%, but then increased slightly, with the latest being 2 weeks ago, at 5.7%.  -He does not require any medications for now -He is doing a great job with diet and exercise.  Philemon Kingdom, MD PhD [facility] Endocrinology

## 2022-10-18 ENCOUNTER — Ambulatory Visit: Payer: BC Managed Care – PPO | Admitting: Internal Medicine

## 2023-09-26 ENCOUNTER — Ambulatory Visit: Payer: BC Managed Care – PPO | Admitting: Internal Medicine

## 2023-09-26 ENCOUNTER — Encounter: Payer: Self-pay | Admitting: Internal Medicine

## 2023-09-26 VITALS — BP 122/70 | HR 46 | Ht 68.0 in | Wt 164.0 lb

## 2023-09-26 DIAGNOSIS — R7303 Prediabetes: Secondary | ICD-10-CM | POA: Diagnosis not present

## 2023-09-26 DIAGNOSIS — E063 Autoimmune thyroiditis: Secondary | ICD-10-CM | POA: Diagnosis not present

## 2023-09-26 NOTE — Patient Instructions (Signed)
 Please continue Unithroid  137 mcg daily.  Take the thyroid  hormone every day, with water, at least 30 minutes before breakfast, separated by at least 4 hours from: - acid reflux medications - calcium - iron - multivitamins  Please stop at the lab.  Please return in 1 year.

## 2023-09-26 NOTE — Progress Notes (Signed)
 Patient ID: Nathan Estrada, male   DOB: 09-04-1960, 63 y.o.   MRN: [id number]   HPI  Nathan Estrada is a 63 y.o.-year-old male, returning for f/u for Hashimoto's hypothyroidism, dx 1998, and prediabetes. Last visit 1 year ago.  Interim history: He still trains intensively - running and crosstraining. Ave 35 mi a week running, strength training. He had 3 gum sx'es last visit- Prednisone for all 3. He had COVID-19 after traveling in 07/2023. Labs obtained by PCP at that time including CBC, CMP, ferritin, vitamin D, vitamin B12, testosterone were all normal. Last month he was also treated for acute blepharitis. He had some more stress at work this past year.   Elevated HbA1c: In 10/2016, he had a slightly elevated HbA1c of 5.8%, as checked per PCP.  A repeat HbA1c was 5.7%.  Since then, he cut out added sugars, increased animal protein, and then switched to a low fat diet.  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  He was wondering whether he needed T3 supplementation.  I was out of the office with my colleague send him a message that this is not needed in the setting of a normal TSH.  He continues on LT4 only.  He is on Unithroid  d.a.w. 137 mcg daily, taken: - in am - fasting - was drinking coffee with half-and-half immediately after taking Unithroid  >> now black, decaf - 2h from b'fast - + Liquid Ca at night - + Fe 2-3x a week - no PPIs - + MVI later in the day - not on Biotin  He also tried levothyroxine and Synthroid but had fatigue and fluctuating  TFTs. He previously tried alpha lipoic acid and B1 >> dizziness, fogginess >> stopped. He was previously on l-carnitine to help with energy.  His TSH returned slightly high, so he stopped this. On Creatine. On Melatonin.  Reviewed his TFTs: Lab Results  Component Value Date   TSH 0.93 09/20/2022   TSH 0.64 03/08/2022   TSH 0.48 10/19/2021   TSH 2.11 06/01/2021   TSH 4.80 03/30/2021   FREET4 1.38 09/20/2022   FREET4 1.63 (H) 03/08/2022   FREET4 1.53 10/19/2021   FREET4 1.41 06/01/2021   FREET4 1.06 03/30/2021  06/03/2013: TSH 5.5, TPO Abs 202   He had elevated TPO antibodies: Component     Latest Ref Rng & Units 01/22/2015  Thyroperoxidase Ab SerPl-aCnc     <9 IU/mL 201 (H)  TPO Abs 1479 at dx  Pt denies: - feeling nodules in neck - hoarseness - dysphagia - choking  He had a peritonsillar abscess in 01/2017 >> Prednisone, ABx. Saw ENT afterwards >> lanced. 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  A bone scan showed other lesions in pelvis and scalp.    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 He continues to Technical sales engineer.  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   ROS: + see HPI  I reviewed pt's medications, allergies, PMH, social hx, family hx, and changes were documented in the history of present illness. Otherwise, unchanged from my initial visit note.  Past medical history: - Mild hyperlipidemia - Dysthymia  Past surgical history: - None  History   Social History   Marital Status: Married    Spouse Name: N/A    Number of Children: 5   Occupational History   Engineer/counselor/student   Social History Main Topics   Smoking status: Never Smoker    Smokeless tobacco: No   Alcohol Use: Yes, 1-2 drinks once a week: Beer and mixed drinks   Drug Use: No  For exercise, he is walking,  running, doing strength exercises, yoga 3-4 times a week  Current Outpatient Medications  Medication Sig Dispense Refill   Cyanocobalamin (B-12) 1000 MCG CAPS Take 1 capsule by mouth every evening.     Melatonin 3 MG TABS Take 1 tablet by mouth daily.     Multiple Vitamin (MULTIVITAMIN) tablet Take 0.5 tablets by mouth daily.     UNITHROID  137 MCG tablet TAKE 1 TABLET BY MOUTH EVERY DAY BEFORE BREAKFAST 90 tablet 3   No current facility-administered medications for this visit.   Allergies  Allergen Reactions   Doxycycline Rash   Family history: See history of present illness +: - diabetes: Paternal grandmother - hypertension: Father - Hyperlipidemia: Father - Cancer: Father and grandfather (prostate)  PE: BP 122/70   Pulse (!) 46   Ht 5\' 8"  (1.727 m)   Wt 164 lb (74.4 kg)   SpO2 99%   BMI 24.94 kg/m    Wt Readings from Last 3 Encounters:  09/26/23 164 lb (74.4 kg)  10/04/22 156 lb 12.8 oz (71.1 kg)  10/19/21 161 lb 3.2 oz (73.1 kg)   Constitutional: normal weight, in NAD Eyes: EOMI, no exophthalmos ENT: no thyromegaly, no cervical lymphadenopathy Cardiovascular: Bradycardia, RR, No MRG Respiratory: CTA B Musculoskeletal: + deformities (Dupuytren contraction in the right fifth finger) Skin: o rashes Neurological: no tremor with outstretched hands  ASSESSMENT: 1. Hypothyroidism - 2/2 Hashimoto's thyroidism  2.  Prediabetes  PLAN:  1. Patient with longstanding autoimmune hypothyroidism -He continues on Unithroid  d.a.w.  We tried generic levothyroxine and Synthroid d.a.w. but he could not tolerate this well due to fatigue and fluctuating TFTs - latest thyroid  labs reviewed with pt. >> normal: Lab Results  Component Value Date   TSH 0.93 09/20/2022  - he continues on LT4 137 mcg daily (Unithroid  d.a.w.) - pt feels good on this dose.  He realized that for better thyroid  results he needs to skip exercise the day before. - we discussed about taking the thyroid   hormone every day, with water, >30 minutes before breakfast, separated by >4 hours from acid reflux medications, calcium, iron, multivitamins. Pt. is taking it correctly. - will check thyroid  tests today: TSH and fT4 - If labs are abnormal, he will need to return for repeat TFTs in 1.5 months I will see him back in a year  2.  Prediabetes -Patient was diagnosed with prediabetes in 2019 -He made several changes afterwards: Decrease his melatonin supplement, changed his diet (high-protein low-carb), and increased exercise.  We did discuss about possibly reducing animal proteins at previous visits since this was conducive to insulin resistance and cellular aging.  He switched to a low-fat diet.  He reduced and stopped his carnitine supplement.  His HbA1c improved  to 5.4% and before last visit this was slightly higher at 5.7% - At today's visit he has no increased urination or thirst, blurry vision, unintentional weight loss - He is on a great job with diet and exercise; we discussed that his steroid courses could have influenced his HbA1c and - Will recheck this today  Orders Placed This Encounter  Procedures   TSH   T4, free   Hemoglobin A1c   Needs refills.  Emilie Harden, MD PhD [facility]

## 2023-09-27 ENCOUNTER — Encounter: Payer: Self-pay | Admitting: Internal Medicine

## 2023-09-27 LAB — TSH: TSH: 0.7 m[IU]/L (ref 0.40–4.50)

## 2023-09-27 LAB — HEMOGLOBIN A1C
Hgb A1c MFr Bld: 5.9 % — ABNORMAL HIGH (ref <5.7)
Mean Plasma Glucose: 123 mg/dL
eAG (mmol/L): 6.8 mmol/L

## 2023-09-27 LAB — T4, FREE: Free T4: 1.8 ng/dL (ref 0.8–1.8)

## 2023-09-27 MED ORDER — UNITHROID 137 MCG PO TABS: ORAL_TABLET | ORAL | 3 refills | Status: AC

## 2023-09-27 NOTE — Addendum Note (Signed)
 Addended by: Emilie Harden on: 09/27/2023 12:10 PM   Modules accepted: Orders

## 2023-12-14 ENCOUNTER — Other Ambulatory Visit: Payer: Self-pay | Admitting: Urology

## 2023-12-14 DIAGNOSIS — R972 Elevated prostate specific antigen [PSA]: Secondary | ICD-10-CM

## 2023-12-14 DIAGNOSIS — Z8042 Family history of malignant neoplasm of prostate: Secondary | ICD-10-CM

## 2023-12-17 ENCOUNTER — Encounter: Payer: Self-pay | Admitting: Urology

## 2024-01-16 ENCOUNTER — Ambulatory Visit
Admission: RE | Admit: 2024-01-16 | Discharge: 2024-01-16 | Disposition: A | Discharge status: Home / Self Care | Source: Ambulatory Visit | Attending: Urology

## 2024-01-16 DIAGNOSIS — R972 Elevated prostate specific antigen [PSA]: Secondary | ICD-10-CM

## 2024-01-16 DIAGNOSIS — Z8042 Family history of malignant neoplasm of prostate: Secondary | ICD-10-CM

## 2024-01-16 MED ORDER — GADOPICLENOL 0.5 MMOL/ML IV SOLN
8.0000 mL | Freq: Once | INTRAVENOUS | Status: AC | PRN
  Administered 2024-01-16: 8 mL via INTRAVENOUS

## 2024-01-22 ENCOUNTER — Encounter: Payer: Self-pay | Admitting: Internal Medicine

## 2024-09-25 ENCOUNTER — Ambulatory Visit: Admitting: Internal Medicine
